# Patient Record
Sex: Female | Born: 1950 | Hispanic: No | Marital: Married | Smoking: Never smoker
Health system: Southern US, Community
[De-identification: ages and names within clinical notes are randomized; demographics above are authoritative.]

## PROBLEM LIST (undated history)

## (undated) DIAGNOSIS — Z8041 Family history of malignant neoplasm of ovary: Secondary | ICD-10-CM

## (undated) DIAGNOSIS — Z973 Presence of spectacles and contact lenses: Secondary | ICD-10-CM

## (undated) DIAGNOSIS — Z789 Other specified health status: Secondary | ICD-10-CM

## (undated) DIAGNOSIS — Z8042 Family history of malignant neoplasm of prostate: Secondary | ICD-10-CM

## (undated) DIAGNOSIS — Z8051 Family history of malignant neoplasm of kidney: Secondary | ICD-10-CM

## (undated) DIAGNOSIS — M5030 Other cervical disc degeneration, unspecified cervical region: Secondary | ICD-10-CM

## (undated) DIAGNOSIS — M8580 Other specified disorders of bone density and structure, unspecified site: Secondary | ICD-10-CM

## (undated) HISTORY — PX: TONSILLECTOMY: SUR1361

## (undated) HISTORY — DX: Family history of malignant neoplasm of kidney: Z80.51

## (undated) HISTORY — DX: Family history of malignant neoplasm of prostate: Z80.42

## (undated) HISTORY — DX: Family history of malignant neoplasm of ovary: Z80.41

---

## 1994-06-08 HISTORY — PX: ABDOMINAL HYSTERECTOMY: SHX81

## 1999-03-26 ENCOUNTER — Other Ambulatory Visit: Admission: RE | Admit: 1999-03-26 | Discharge: 1999-03-26 | Payer: Self-pay | Admitting: Gynecology

## 2000-07-28 ENCOUNTER — Other Ambulatory Visit: Admission: RE | Admit: 2000-07-28 | Discharge: 2000-07-28 | Payer: Self-pay | Admitting: Obstetrics and Gynecology

## 2001-08-29 ENCOUNTER — Other Ambulatory Visit: Admission: RE | Admit: 2001-08-29 | Discharge: 2001-08-29 | Payer: Self-pay | Admitting: Obstetrics and Gynecology

## 2002-09-13 ENCOUNTER — Other Ambulatory Visit: Admission: RE | Admit: 2002-09-13 | Discharge: 2002-09-13 | Payer: Self-pay | Admitting: Obstetrics and Gynecology

## 2005-02-12 ENCOUNTER — Other Ambulatory Visit: Admission: RE | Admit: 2005-02-12 | Discharge: 2005-02-12 | Payer: Self-pay | Admitting: Obstetrics and Gynecology

## 2006-10-22 ENCOUNTER — Ambulatory Visit: Payer: Self-pay | Admitting: Sports Medicine

## 2006-10-22 DIAGNOSIS — M549 Dorsalgia, unspecified: Secondary | ICD-10-CM | POA: Insufficient documentation

## 2006-10-22 DIAGNOSIS — M2560 Stiffness of unspecified joint, not elsewhere classified: Secondary | ICD-10-CM

## 2008-02-29 ENCOUNTER — Ambulatory Visit: Payer: Self-pay | Admitting: Hematology

## 2008-06-08 HISTORY — PX: BILATERAL SALPINGOOPHORECTOMY: SHX1223

## 2008-08-28 ENCOUNTER — Encounter (INDEPENDENT_AMBULATORY_CARE_PROVIDER_SITE_OTHER): Payer: Self-pay | Admitting: Obstetrics and Gynecology

## 2008-08-28 ENCOUNTER — Ambulatory Visit (HOSPITAL_COMMUNITY): Admission: RE | Admit: 2008-08-28 | Discharge: 2008-08-28 | Payer: Self-pay | Admitting: Obstetrics and Gynecology

## 2010-09-18 LAB — CBC
HCT: 39 % (ref 36.0–46.0)
MCHC: 33.5 g/dL (ref 30.0–36.0)
MCV: 92.6 fL (ref 78.0–100.0)
Platelets: 241 10*3/uL (ref 150–400)
RBC: 4.21 MIL/uL (ref 3.87–5.11)

## 2010-10-21 NOTE — Op Note (Signed)
NAMERAYANN, JOLLEY               ACCOUNT NO.:  [id number]   MEDICAL RECORD NO.:  [id number]          PATIENT TYPE:  AMB   LOCATION:  SDC                           FACILITY:  [facility]   PHYSICIAN:  Michelle L. Grewal, M.D.DATE OF BIRTH:  1951-06-03   DATE OF PROCEDURE:  08/28/2008  DATE OF DISCHARGE:                               OPERATIVE REPORT   PREOPERATIVE DIAGNOSES:  Family history of breast cancer and desires  prophylactic oophorectomy.   POSTOPERATIVE DIAGNOSES:  Family history of breast cancer and desires  prophylactic oophorectomy.   PROCEDURE:  Laparoscopic bilateral salpingo-oophorectomy.   SURGEON:  Michelle L. Vincente Poli, MD   ANESTHESIA:  General.   FINDINGS:  Adherent ovaries to pelvic sidewall.   SPECIMENS:  Ovaries and fallopian tubes sent to Pathology.   ESTIMATED BLOOD LOSS:  Minimal.   COMPLICATIONS:  None.   PROCEDURE IN DETAIL:  The patient was taken to the operating room.  She  was intubated without difficulty.  She was then prepped and draped in  the usual sterile fashion.  Prior to going to the operating room, the  patient had been counseled in the office about the known risk of the  procedure, which included risk of anesthesia, risk of infection, risk of  excessive bleeding, risk of injury to internal organs such as the ureter  and the bowel and the bladder.  The patient had been counseled and  accepted these known risk.  After she was intubated, she was prepped and  draped in the standard fashion, in-and-out catheter was used to empty  the bladder, and a sponge stick was placed in the vagina.  Attention was  turned to the abdomen and small infraumbilical incision was made and  carried down and the Veress needle was inserted one time and  pneumoperitoneum was performed.  The Veress needle was removed.  The 11-  mm trocar was inserted through the same incision.  The laparoscope was  introduced through the trocar sheath.  Everything and the bowel  appeared  normal.  The patient was placed in Trendelenburg position.  A second 5-  mm suprapubic trocar was inserted under direct visualization.  Exam of  the pelvis revealed the following.  The patient was status post  hysterectomy.  Her ovaries were small and appeared normal.  They were  adherent to the pelvic sidewall.  I initially identified both ureters  and they were well below the ovaries and I noticed peristalsis of both  sides.  An atraumatic grasper was used to grasp the left tube and ovary.  The EnSeal instrument was placed just beneath the ovary on the fallopian  tube across the IP ligament and it was cauterized and this was carried  through until the entire specimen was resected.  Careful attention was  used to just stay just beneath the ovary and avoid the ureter.  I noted  normal peristalsis after the area where the ovary had been where it is  kind of a raw surface, because it was adherent to the sidewall.  There  is a little bit of oozing on that side, which  was hemostatic after we  used Kleppingers to both the area.  Again, ureteral peristalsis was  noted after this was done.  We then turned our attention to the right  side where the right tube and ovary was grasped with an atraumatic  grasper and in similar fashion EnSeal instrument was used to place  across the IP ligament and then it was cauterized and until we got  around the tube and ovary.  Again normal ureteral peristalsis was noted  afterwards.  The 5-mm trocar was then switched to a 10 under direct  visualization.  The Endobag was inserted and the specimens were inserted  into the Endobag and they were removed easily without any difficulty.  Irrigation was performed.  Hemostasis was again noted.  Because of the  raw surface on the left pelvic sidewall, I did place a piece of  Interceed over there to hopefully avoid any bowel adhesion to that area.  Pneumoperitoneum was released.  The trocars were removed.  The  incisions  were closed with 0 Vicryl and then the skin was closed with 3-0 Vicryl,  and then skin was sealed with Dermabond.  All sponge, lap, and  instrument counts were correct x2.  The patient went to recovery room in  stable condition.      Michelle L. Vincente Poli, M.D.  Electronically Signed     MLG/MEDQ  D:  08/28/2008  T:  08/28/2008  Job:  [id number]

## 2012-06-10 ENCOUNTER — Other Ambulatory Visit: Payer: Self-pay | Admitting: Orthopedic Surgery

## 2012-06-13 ENCOUNTER — Encounter (HOSPITAL_BASED_OUTPATIENT_CLINIC_OR_DEPARTMENT_OTHER): Payer: Self-pay | Admitting: *Deleted

## 2012-06-13 NOTE — Progress Notes (Signed)
No htn-no resp problems

## 2012-06-14 ENCOUNTER — Encounter (HOSPITAL_BASED_OUTPATIENT_CLINIC_OR_DEPARTMENT_OTHER): Payer: Self-pay | Admitting: *Deleted

## 2012-06-14 ENCOUNTER — Ambulatory Visit (HOSPITAL_BASED_OUTPATIENT_CLINIC_OR_DEPARTMENT_OTHER)
Admission: RE | Admit: 2012-06-14 | Discharge: 2012-06-14 | Disposition: A | Discharge status: Home / Self Care | Payer: BC Managed Care – PPO | Source: Ambulatory Visit | Attending: Orthopedic Surgery | Admitting: Orthopedic Surgery

## 2012-06-14 ENCOUNTER — Encounter (HOSPITAL_BASED_OUTPATIENT_CLINIC_OR_DEPARTMENT_OTHER): Payer: Self-pay | Admitting: Anesthesiology

## 2012-06-14 ENCOUNTER — Ambulatory Visit (HOSPITAL_BASED_OUTPATIENT_CLINIC_OR_DEPARTMENT_OTHER): Payer: BC Managed Care – PPO | Admitting: Anesthesiology

## 2012-06-14 ENCOUNTER — Encounter (HOSPITAL_BASED_OUTPATIENT_CLINIC_OR_DEPARTMENT_OTHER)
Admission: RE | Disposition: A | Discharge status: Home / Self Care | Payer: Self-pay | Source: Ambulatory Visit | Attending: Orthopedic Surgery

## 2012-06-14 DIAGNOSIS — R229 Localized swelling, mass and lump, unspecified: Secondary | ICD-10-CM | POA: Insufficient documentation

## 2012-06-14 HISTORY — DX: Other specified disorders of bone density and structure, unspecified site: M85.80

## 2012-06-14 HISTORY — DX: Other cervical disc degeneration, unspecified cervical region: M50.30

## 2012-06-14 HISTORY — DX: Other specified health status: Z78.9

## 2012-06-14 HISTORY — DX: Presence of spectacles and contact lenses: Z97.3

## 2012-06-14 HISTORY — PX: MASS EXCISION: SHX2000

## 2012-06-14 LAB — POCT HEMOGLOBIN-HEMACUE: Hemoglobin: 10.6 g/dL — ABNORMAL LOW (ref 12.0–15.0)

## 2012-06-14 SURGERY — EXCISION MASS
Anesthesia: Monitor Anesthesia Care | Site: Finger | Laterality: Right | Wound class: Clean

## 2012-06-14 MED ORDER — OXYCODONE HCL 5 MG PO TABS: 5.0000 mg | ORAL_TABLET | Freq: Once | ORAL | Status: DC | PRN

## 2012-06-14 MED ORDER — LACTATED RINGERS IV SOLN
INTRAVENOUS | Status: DC
  Administered 2012-06-14 (×2): via INTRAVENOUS

## 2012-06-14 MED ORDER — FENTANYL CITRATE 0.05 MG/ML IJ SOLN
INTRAMUSCULAR | Status: DC | PRN
  Administered 2012-06-14: 50 ug via INTRAVENOUS

## 2012-06-14 MED ORDER — PROPOFOL INFUSION 10 MG/ML OPTIME
INTRAVENOUS | Status: DC | PRN
  Administered 2012-06-14: 50 ug/kg/min via INTRAVENOUS

## 2012-06-14 MED ORDER — ONDANSETRON HCL 4 MG/2ML IJ SOLN: 4.0000 mg | Freq: Four times a day (QID) | INTRAMUSCULAR | Status: DC | PRN

## 2012-06-14 MED ORDER — CEFAZOLIN SODIUM-DEXTROSE 2-3 GM-% IV SOLR: 2.0000 g | Freq: Once | INTRAVENOUS | Status: DC

## 2012-06-14 MED ORDER — OXYCODONE HCL 5 MG/5ML PO SOLN: 5.0000 mg | Freq: Once | ORAL | Status: DC | PRN

## 2012-06-14 MED ORDER — FENTANYL CITRATE 0.05 MG/ML IJ SOLN: 25.0000 ug | INTRAMUSCULAR | Status: DC | PRN

## 2012-06-14 MED ORDER — PERCOCET 5-325 MG PO TABS: ORAL_TABLET | ORAL | Status: DC

## 2012-06-14 MED ORDER — LIDOCAINE HCL (CARDIAC) 20 MG/ML IV SOLN
INTRAVENOUS | Status: DC | PRN
  Administered 2012-06-14: 30 mg via INTRAVENOUS

## 2012-06-14 MED ORDER — CHLORHEXIDINE GLUCONATE 4 % EX LIQD: 60.0000 mL | Freq: Once | CUTANEOUS | Status: DC

## 2012-06-14 MED ORDER — ONDANSETRON HCL 4 MG/2ML IJ SOLN
INTRAMUSCULAR | Status: DC | PRN
  Administered 2012-06-14: 4 mg via INTRAVENOUS

## 2012-06-14 MED ORDER — LIDOCAINE HCL (PF) 0.5 % IJ SOLN
INTRAMUSCULAR | Status: DC | PRN
  Administered 2012-06-14: 30 mL via INTRAVENOUS

## 2012-06-14 MED ORDER — BUPIVACAINE HCL (PF) 0.25 % IJ SOLN
INTRAMUSCULAR | Status: DC | PRN
  Administered 2012-06-14: 10 mL

## 2012-06-14 MED ORDER — MIDAZOLAM HCL 5 MG/5ML IJ SOLN
INTRAMUSCULAR | Status: DC | PRN
  Administered 2012-06-14: 1 mg via INTRAVENOUS

## 2012-06-14 SURGICAL SUPPLY — 48 items
BANDAGE CONFORM 2  STR LF (GAUZE/BANDAGES/DRESSINGS) ×1 IMPLANT
BANDAGE ELASTIC 3 VELCRO ST LF (GAUZE/BANDAGES/DRESSINGS) IMPLANT
BANDAGE GAUZE ELAST BULKY 4 IN (GAUZE/BANDAGES/DRESSINGS) IMPLANT
BLADE MINI RND TIP GREEN BEAV (BLADE) IMPLANT
BLADE SURG 15 STRL LF DISP TIS (BLADE) ×2 IMPLANT
BLADE SURG 15 STRL SS (BLADE) ×4
BNDG CMPR 9X4 STRL LF SNTH (GAUZE/BANDAGES/DRESSINGS)
BNDG CMPR MD 5X2 ELC HKLP STRL (GAUZE/BANDAGES/DRESSINGS)
BNDG COHESIVE 1X5 TAN STRL LF (GAUZE/BANDAGES/DRESSINGS) ×2 IMPLANT
BNDG ELASTIC 2 VLCR STRL LF (GAUZE/BANDAGES/DRESSINGS) IMPLANT
BNDG ESMARK 4X9 LF (GAUZE/BANDAGES/DRESSINGS) IMPLANT
CHLORAPREP W/TINT 26ML (MISCELLANEOUS) ×2 IMPLANT
CLOTH BEACON ORANGE TIMEOUT ST (SAFETY) ×2 IMPLANT
CORDS BIPOLAR (ELECTRODE) ×1 IMPLANT
COVER MAYO STAND STRL (DRAPES) ×2 IMPLANT
COVER TABLE BACK 60X90 (DRAPES) ×2 IMPLANT
CUFF TOURNIQUET SINGLE 18IN (TOURNIQUET CUFF) ×2 IMPLANT
DRAPE EXTREMITY T 121X128X90 (DRAPE) ×2 IMPLANT
DRAPE SURG 17X23 STRL (DRAPES) ×2 IMPLANT
GAUZE SPONGE 4X4 12PLY STRL LF (GAUZE/BANDAGES/DRESSINGS) ×1 IMPLANT
GAUZE XEROFORM 1X8 LF (GAUZE/BANDAGES/DRESSINGS) ×2 IMPLANT
GLOVE BIO SURGEON STRL SZ7.5 (GLOVE) ×3 IMPLANT
GOWN PREVENTION PLUS XLARGE (GOWN DISPOSABLE) ×1 IMPLANT
GOWN STRL REIN XL XLG (GOWN DISPOSABLE) ×3 IMPLANT
NDL FILTER BLUNT 18X1 1/2 (NEEDLE) IMPLANT
NDL HYPO 25X1 1.5 SAFETY (NEEDLE) IMPLANT
NEEDLE FILTER BLUNT 18X 1/2SAF (NEEDLE)
NEEDLE FILTER BLUNT 18X1 1/2 (NEEDLE) IMPLANT
NEEDLE HYPO 25X1 1.5 SAFETY (NEEDLE) ×2 IMPLANT
NS IRRIG 1000ML POUR BTL (IV SOLUTION) ×2 IMPLANT
PACK BASIN DAY SURGERY FS (CUSTOM PROCEDURE TRAY) ×2 IMPLANT
PAD CAST 3X4 CTTN HI CHSV (CAST SUPPLIES) IMPLANT
PADDING CAST ABS 4INX4YD NS (CAST SUPPLIES) ×1
PADDING CAST ABS COTTON 4X4 ST (CAST SUPPLIES) ×1 IMPLANT
PADDING CAST COTTON 3X4 STRL (CAST SUPPLIES)
SPONGE GAUZE 4X4 12PLY (GAUZE/BANDAGES/DRESSINGS) ×2 IMPLANT
STOCKINETTE 4X48 STRL (DRAPES) ×2 IMPLANT
STRIP CLOSURE SKIN 1/4X4 (GAUZE/BANDAGES/DRESSINGS) ×1 IMPLANT
SUT ETHILON 3 0 PS 1 (SUTURE) IMPLANT
SUT ETHILON 4 0 PS 2 18 (SUTURE) IMPLANT
SUT MON AB 5-0 PS2 18 (SUTURE) ×1 IMPLANT
SWAB COLLECTION DEVICE MRSA (MISCELLANEOUS) IMPLANT
SYR BULB 3OZ (MISCELLANEOUS) ×2 IMPLANT
SYR CONTROL 10ML LL (SYRINGE) ×1 IMPLANT
TOWEL OR 17X24 6PK STRL BLUE (TOWEL DISPOSABLE) ×3 IMPLANT
TUBE ANAEROBIC SPECIMEN COL (MISCELLANEOUS) IMPLANT
UNDERPAD 30X30 INCONTINENT (UNDERPADS AND DIAPERS) ×2 IMPLANT
WATER STERILE IRR 1000ML POUR (IV SOLUTION) ×1 IMPLANT

## 2012-06-14 NOTE — Brief Op Note (Signed)
06/14/2012  8:26 AM  PATIENT:  Crystal Anthony  62 y.o. female  PRE-OPERATIVE DIAGNOSIS:  RIGHT INDEX Finger Mass  POST-OPERATIVE DIAGNOSIS:  RIGHT INDEX Finger Mass  PROCEDURE:  Procedure(s) (LRB) with comments: EXCISION MASS (Right) - index finger  SURGEON:  Surgeon(s) and Role:    * Tami Ribas, MD - Primary  PHYSICIAN ASSISTANT:   ASSISTANTS: none   ANESTHESIA:   MAC and bier block  EBL:  Total I/O In: 1000 [I.V.:1000] Out: -   BLOOD ADMINISTERED:none  DRAINS: none   LOCAL MEDICATIONS USED:  MARCAINE     SPECIMEN:  Source of Specimen:  right index  DISPOSITION OF SPECIMEN:  PATHOLOGY  COUNTS:  YES  TOURNIQUET:   Total Tourniquet Time Documented: Forearm (Right) - 31 minutes  DICTATION: .Other Dictation: Dictation Number [id number]  PLAN OF CARE: Discharge to home after PACU  PATIENT DISPOSITION:  PACU - hemodynamically stable.

## 2012-06-14 NOTE — Anesthesia Postprocedure Evaluation (Signed)
Anesthesia Post Note  Patient: Crystal Anthony  Procedure(s) Performed: Procedure(s) (LRB): EXCISION MASS (Right)  Anesthesia type: MAC and Bier block  Patient location: PACU  Post pain: Pain level controlled and Adequate analgesia  Post assessment: Post-op Vital signs reviewed, Patient's Cardiovascular Status Stable and Respiratory Function Stable  Last Vitals:  Filed Vitals:   06/14/12 0856  BP:   Pulse: 75  Temp:   Resp: 13    Post vital signs: Reviewed and stable  Level of consciousness: awake, alert  and oriented  Complications: No apparent anesthesia complications

## 2012-06-14 NOTE — H&P (Signed)
  Crystal Anthony is an 62 y.o. female.   Chief Complaint: right index finger mass HPI: 62 yo rhd female with 2 months of mass/foreign body on the dorsum of the right index finger at PIP joint.  Rose thorn injury to area in November.  She was able to remove a portion of the thorn but has had a bothersome mass in the area since.  No fevers, chills, night sweats.  Past Medical History  Diagnosis Date  . No pertinent past medical history   . DDD (degenerative disc disease), cervical   . Osteopenia   . Wears glasses     Past Surgical History  Procedure Date  . Abdominal hysterectomy   . Tonsillectomy   . Bilateral salpingoophorectomy 2010    History reviewed. No pertinent family history. Social History:  reports that she has never smoked. She does not have any smokeless tobacco history on file. She reports that she drinks alcohol. She reports that she does not use illicit drugs.  Allergies: No Known Allergies  Medications Prior to Admission  Medication Sig Dispense Refill  . amphetamine-dextroamphetamine (ADDERALL XR) 15 MG 24 hr capsule Take 15 mg by mouth every morning.      . calcium carbonate (OS-CAL - DOSED IN MG OF ELEMENTAL CALCIUM) 1250 MG tablet Take 1 tablet by mouth daily.      . cholecalciferol (VITAMIN D) 1000 UNITS tablet Take 1,000 Units by mouth daily.      Marland Kitchen ibandronate (BONIVA) 150 MG tablet Take 150 mg by mouth every 30 (thirty) days. Take in the morning with a full glass of water, on an empty stomach, and do not take anything else by mouth or lie down for the next 30 min.        Results for orders placed during the hospital encounter of 06/14/12 (from the past 48 hour(s))  POCT HEMOGLOBIN-HEMACUE     Status: Abnormal   Collection Time   06/14/12  6:45 AM      Component Value Range Comment   Hemoglobin 10.6 (*) 12.0 - 15.0 g/dL     No results found.   A comprehensive review of systems was negative except for: Eyes: positive for contacts/glasses  Blood  pressure 143/87, pulse 76, temperature 97.7 F (36.5 C), temperature source Oral, resp. rate 18, height 5\' 7"  (1.702 m), weight 66.86 kg (147 lb 6.4 oz), SpO2 100.00%.  General appearance: alert, cooperative and appears stated age Head: Normocephalic, without obvious abnormality, atraumatic Neck: supple, symmetrical, trachea midline Resp: clear to auscultation bilaterally Cardio: regular rate and rhythm GI: non tender Extremities: light touch sensation and capillary refill intact all digits.  +epl/fpl/io.  mass on dorsum of right index finger at pip joint.  no skin changes. Pulses: 2+ and symmetric Skin: Skin color, texture, turgor normal. No rashes or lesions Neurologic: Grossly normal Incision/Wound: na  Assessment/Plan Right index finger mass vs retained foreign body.  Non operative and operative treatment options were discussed with the patient and patient wishes to proceed with operative treatment. Risks, benefits, and alternatives of surgery were discussed and the patient agrees with the plan of care.   Chenille Toor R 06/14/2012, 7:38 AM

## 2012-06-14 NOTE — Anesthesia Preprocedure Evaluation (Signed)
Anesthesia Evaluation  Patient identified by MRN, date of birth, ID band Patient awake    Reviewed: Allergy & Precautions, H&P , NPO status , Patient's Chart, lab work & pertinent test results  Airway Mallampati: II  Neck ROM: full    Dental   Pulmonary          Cardiovascular     Neuro/Psych    GI/Hepatic   Endo/Other    Renal/GU      Musculoskeletal   Abdominal   Peds  Hematology   Anesthesia Other Findings   Reproductive/Obstetrics                           Anesthesia Physical Anesthesia Plan  ASA: II  Anesthesia Plan: MAC   Post-op Pain Management:    Induction: Intravenous  Airway Management Planned: Simple Face Mask  Additional Equipment:   Intra-op Plan:   Post-operative Plan:   Informed Consent: I have reviewed the patients History and Physical, chart, labs and discussed the procedure including the risks, benefits and alternatives for the proposed anesthesia with the patient or authorized representative who has indicated his/her understanding and acceptance.     Plan Discussed with: CRNA and Surgeon  Anesthesia Plan Comments:         Anesthesia Quick Evaluation

## 2012-06-14 NOTE — Transfer of Care (Signed)
Immediate Anesthesia Transfer of Care Note  Patient: Crystal Anthony  Procedure(s) Performed: Procedure(s) (LRB) with comments: EXCISION MASS (Right) - index finger  Patient Location: PACU  Anesthesia Type:Bier block  Level of Consciousness: awake, alert , oriented and patient cooperative  Airway & Oxygen Therapy: Patient Spontanous Breathing and Patient connected to face mask oxygen  Post-op Assessment: Report given to PACU RN and Post -op Vital signs reviewed and stable  Post vital signs: Reviewed and stable  Complications: No apparent anesthesia complications

## 2012-06-14 NOTE — Anesthesia Procedure Notes (Signed)
Procedure Name: MAC Date/Time: 06/14/2012 7:50 AM Performed by: Bern Fare D Pre-anesthesia Checklist: Patient identified, Emergency Drugs available, Suction available, Patient being monitored and Timeout performed Patient Re-evaluated:Patient Re-evaluated prior to inductionOxygen Delivery Method: Simple face mask

## 2012-06-15 NOTE — Op Note (Signed)
NAMEJEROME, Anthony NO.:  [id number]  MEDICAL RECORD NO.:  LOCATION:                                 FACILITY:  PHYSICIAN:  Betha Loa, MD             DATE OF BIRTH:  DATE OF PROCEDURE: DATE OF DISCHARGE:                              OPERATIVE REPORT   PREOPERATIVE DIAGNOSIS:  Right index finger mass.  POSTOPERATIVE DIAGNOSIS:  Right index finger mass.  PROCEDURE:  Excision of mass, right index finger.  SURGEON:  Betha Loa, MD  ASSISTANT:  None.  ANESTHESIA:  Bier block with sedation.  IV FLUIDS:  Per anesthesia flow sheet.  ESTIMATED BLOOD LOSS:  Minimal.  COMPLICATIONS:  None.  SPECIMENS:  Right index finger mass to Pathology.  TOURNIQUET TIME:  Thirty-one minutes.  DISPOSITION:  Stable to PACU.  INDICATIONS:  [name] is a 62 year old female who states that 2 months ago, she poked her right index finger with a rose thorn.  She was able to remove most of the thorn, but has had a mass since.  She thinks there may be some foreign body remaining.  It is bothersome to her.  She wished to have it removed.  Risks, benefits, and alternatives of surgery were discussed including the risk of blood loss, infection, damage to nerves, vessels, tendons, ligaments, bone; failure of surgery; need for additional surgery, complications with wound healing, continued pain, retained foreign body, and recurrence of mass.  She voiced understanding of these risks and elected to proceed.  OPERATIVE COURSE:  After being identified preoperatively by myself, the patient and I agreed upon procedure and site procedure.  Surgical site was marked.  Risks, benefits, and alternatives of surgery were reviewed and she wished to proceed.  Surgical consent had been signed.  She was given 2 g of IV Ancef as preoperative antibiotic prophylaxis.  She was transferred to the operating room, placed the operating room table in supine position with the right upper extremity on  arm board.  Bier block anesthesia was induced by the anesthesiologist.  Right upper extremity was prepped and draped in normal sterile orthopedic fashion.  A surgical pause was performed between surgeons, anesthesia, operating staff, and all were in agreement as to the patient, procedure, site procedure.  The Bier block was augmented with a digital block performed with 10 mL of 0.25% plain Marcaine.  A curvilinear incision was made over the dorsum of the PIP joint of the index finger.  It was carried into subcutaneous tissues by spreading technique.  The mass was easily identified.  It was released of all soft tissue attachments.  It was not strongly adherent. It was removed in its entirety.  It was peeled off from the dorsum of the extensor tendon.  The extensor tendon was intact.  The mass was sent to Pathology for examination.  The wound was copiously irrigated with sterile saline.  Bipolar electrocautery was used to obtain hemostasis. The wound was then closed with 5-0 Monocryl in a running subcuticular fashion.  This was augmented with Steri-Strips.  The wound was dressed with sterile Xeroform, 4x4s, and wrapped  with a Kling and Coban dressing lightly.  Tourniquet was deflated at 31 minutes.  Fingertips were pink with brisk capillary refill after deflation of tourniquet.  Operative drapes were broken down.  The patient was awoken from anesthesia safely. She was transferred back to stretcher and taken to PACU in stable condition.  I will see her back in the office in 1 week for postoperative followup.  I will give her Percocet 5/325 one to two p.o. q.6 h. p.r.n. pain, dispensed #20.     Betha Loa, MD     KK/MEDQ  D:  06/14/2012  T:  06/14/2012  Job:  [id number]

## 2012-06-16 ENCOUNTER — Encounter (HOSPITAL_BASED_OUTPATIENT_CLINIC_OR_DEPARTMENT_OTHER): Payer: Self-pay | Admitting: Orthopedic Surgery

## 2012-07-11 ENCOUNTER — Other Ambulatory Visit: Payer: Self-pay | Admitting: Obstetrics and Gynecology

## 2013-10-17 ENCOUNTER — Other Ambulatory Visit: Payer: Self-pay | Admitting: Obstetrics and Gynecology

## 2014-05-21 ENCOUNTER — Other Ambulatory Visit: Payer: Self-pay | Admitting: Dermatology

## 2014-12-24 ENCOUNTER — Encounter: Payer: Self-pay | Admitting: Genetic Counselor

## 2014-12-31 ENCOUNTER — Other Ambulatory Visit: Payer: Self-pay | Admitting: Obstetrics and Gynecology

## 2015-01-01 LAB — CYTOLOGY - PAP

## 2015-01-22 ENCOUNTER — Encounter: Payer: Self-pay | Admitting: Genetic Counselor

## 2015-01-22 DIAGNOSIS — Z1379 Encounter for other screening for genetic and chromosomal anomalies: Secondary | ICD-10-CM | POA: Insufficient documentation

## 2015-01-23 ENCOUNTER — Ambulatory Visit (HOSPITAL_BASED_OUTPATIENT_CLINIC_OR_DEPARTMENT_OTHER): Payer: BLUE CROSS/BLUE SHIELD | Admitting: Genetic Counselor

## 2015-01-23 ENCOUNTER — Encounter: Payer: Self-pay | Admitting: Genetic Counselor

## 2015-01-23 ENCOUNTER — Other Ambulatory Visit: Payer: Self-pay

## 2015-01-23 DIAGNOSIS — Z8051 Family history of malignant neoplasm of kidney: Secondary | ICD-10-CM | POA: Diagnosis not present

## 2015-01-23 DIAGNOSIS — Z8042 Family history of malignant neoplasm of prostate: Secondary | ICD-10-CM

## 2015-01-23 DIAGNOSIS — Z315 Encounter for genetic counseling: Secondary | ICD-10-CM

## 2015-01-23 DIAGNOSIS — Z8041 Family history of malignant neoplasm of ovary: Secondary | ICD-10-CM | POA: Insufficient documentation

## 2015-01-23 DIAGNOSIS — Z1379 Encounter for other screening for genetic and chromosomal anomalies: Secondary | ICD-10-CM

## 2015-01-23 NOTE — Progress Notes (Signed)
REFERRING PROVIDER: Dian Queen, MD [address] [place], [place] [address]  PRIMARY PROVIDER:  Cyril Mourning, MD  PRIMARY REASON FOR VISIT:  1. Family history of ovarian cancer   2. Family history of prostate cancer   3. Family history of kidney cancer   4. Genetic testing      HISTORY OF PRESENT ILLNESS:   Crystal Anthony, a 64 y.o. female, was seen for a [facility] cancer genetics consultation at the request of Dr. Helane Rima due to a family history of ovarian, kidney and prostate cancer.  Crystal Anthony presents to clinic today to discuss the possibility of a hereditary predisposition to cancer, genetic testing, and to further clarify her future cancer risks, as well as potential cancer risks for family members. Crystal Anthony is a 64 y.o. female with no personal history of cancer.  She had genetic testing in 2009 and was found to be negative for sequencing mutations in BRCA1, BRCA2, MLH1, MSH2 and MSH6.  CANCER HISTORY:   No history exists.     HORMONAL RISK FACTORS:  Menarche was at age 37-16.  First live birth at age 31.  OCP use for approximately 1 years.  Ovaries intact: no.  Hysterectomy: yes.  Menopausal status: postmenopausal.  HRT use: 7 years. Colonoscopy: no; not examined. Mammogram within the last year: yes. Number of breast biopsies: 0. Up to date with pelvic exams:  yes. Any excessive radiation exposure in the past:  no  Past Medical History  Diagnosis Date  . No pertinent past medical history   . DDD (degenerative disc disease), cervical   . Osteopenia   . Wears glasses   . Family history of ovarian cancer   . Family history of prostate cancer   . Family history of kidney cancer     Past Surgical History  Procedure Laterality Date  . Abdominal hysterectomy    . Tonsillectomy    . Bilateral salpingoophorectomy  2010  . Mass excision  06/14/2012    Procedure: EXCISION MASS;  Surgeon: Tennis Must, MD;  Location: [facility];   Service: Orthopedics;  Laterality: Right;  index finger    Social History   Social History  . Marital Status: Married    Spouse Name: N/A  . Number of Children: 2  . Years of Education: N/A   Social History Main Topics  . Smoking status: Never Smoker   . Smokeless tobacco: None  . Alcohol Use: Yes     Comment: rare  . Drug Use: No  . Sexual Activity: Not Asked   Other Topics Concern  . None   Social History Narrative     FAMILY HISTORY:  We obtained a detailed, 4-generation family history.  Significant diagnoses are listed below: Family History  Problem Relation Age of Onset  . Ovarian cancer Mother 53  . Kidney cancer Father 12  . Bone cancer Father 31    mets?  . Kidney cancer Maternal Uncle     dx in his 102s  . Colon polyps Paternal Aunt   . Heart disease Paternal Aunt   . Prostate cancer Paternal Uncle   . Heart disease Maternal Grandmother   . Heart disease Paternal Grandmother   . Heart disease Paternal Grandfather   . Prostate cancer Maternal Uncle     dx in his 48s  . Prostate cancer Maternal Uncle     dx in his 69s  . Cancer Paternal Uncle     NOS  The patient has two children and one brother who are cancer free.  Her mother was diagnosed with ovarian cancer at age 31.  She had 10 brothers and sisters.  Two brothers had prostate cancer and one brother with kidney cancer.  Her father was diagnosed with kidney cancer in his 70s and died of bone cancer.  He had 11 brothers and sisters.  One brother had prostate cancer and another brother had an unknown form of cancer.  There is no other reported family history of cancer.  Patient's maternal ancestors are of [place]/English and [place] American descent, and paternal ancestors are of [place], [place] and African American descent. There is no reported Ashkenazi Jewish ancestry. There is known consanguinity - her parents are 2nd cousins.  GENETIC COUNSELING ASSESSMENT: Crystal Anthony is a 64 y.o. female with a  family history of ovarian, kidney and prostate cancer which somewhat suggestive of a hereditary cancer syndrome and predisposition to cancer. We, therefore, discussed and recommended the following at today's visit.   DISCUSSION: We discussed the updates of BRCA and Lynch syndrome testing.  We reviewed the characteristics, features and inheritance patterns of hereditary cancer syndromes. We also discussed genetic testing, including the appropriate family members to test, the process of testing, insurance coverage and turn-around-time for results. We discussed the implications of a negative, positive and/or variant of uncertain significant result. We recommended Ms. Klauer pursue genetic testing for the OvaNext gene panel. The OvaNext gene panel offered by [facility] and includes sequencing and rearrangement analysis for the following 24 genes: ATM, BARD1, BRCA1, BRCA2, BRIP1, CDH1, CHEK2, EPCAM, MLH1, MRE11A, MSH2, MSH6, MUTYH, NBN, NF1, PALB2, PMS2, PTEN, RAD50, RAD51C, RAD51D, SMARCA4, STK11, and TP53.   Based on Ms. Brazie's family history of cancer, she meets medical criteria for genetic testing. Despite that she meets criteria, she may still have an out of pocket cost. We discussed that if her out of pocket cost for testing is over $100, the laboratory will call and confirm whether she wants to proceed with testing.  If the out of pocket cost of testing is less than $100 she will be billed by the genetic testing laboratory.   PLAN: After considering the risks, benefits, and limitations, Ms. Hartwig  provided informed consent to pursue genetic testing and the blood sample was sent to [facility] for analysis of the OvaNExt panel test. Results should be available within approximately 2-3 weeks' time, at which point they will be disclosed by telephone to Ms. Schaffer, as will any additional recommendations warranted by these results. Ms. Irigoyen will receive a summary of her genetic counseling visit  and a copy of her results once available. This information will also be available in Epic. We encouraged Ms. Jozwiak to remain in contact with cancer genetics annually so that we can continuously update the family history and inform her of any changes in cancer genetics and testing that may be of benefit for her family. Ms. Quesnel questions were answered to her satisfaction today. Our contact information was provided should additional questions or concerns arise.  Lastly, we encouraged Ms. Prigmore to remain in contact with cancer genetics annually so that we can continuously update the family history and inform her of any changes in cancer genetics and testing that may be of benefit for this family.   Ms.  Mikolajczak questions were answered to her satisfaction today. Our contact information was provided should additional questions or concerns arise. Thank you for the referral and allowing [place] to  share in the care of your patient.   Karen P. Florene Glen, [place], [facility] Certified Genetic Counselor [email or web address] phone: [phone]  The patient was seen for a total of 60 minutes in face-to-face genetic counseling.  This patient was discussed with Drs. Magrinat, Lindi Adie and/or Burr Medico who agrees with the above.    _______________________________________________________________________ For Office Staff:  Number of people involved in session: 1 Was an Intern/ student involved with case: no

## 2015-02-07 ENCOUNTER — Encounter (HOSPITAL_COMMUNITY): Payer: Self-pay

## 2015-02-15 ENCOUNTER — Ambulatory Visit: Payer: Self-pay | Admitting: Genetic Counselor

## 2015-02-15 ENCOUNTER — Telehealth: Payer: Self-pay | Admitting: Genetic Counselor

## 2015-02-15 DIAGNOSIS — Z8051 Family history of malignant neoplasm of kidney: Secondary | ICD-10-CM

## 2015-02-15 DIAGNOSIS — Z8041 Family history of malignant neoplasm of ovary: Secondary | ICD-10-CM

## 2015-02-15 DIAGNOSIS — Z8042 Family history of malignant neoplasm of prostate: Secondary | ICD-10-CM

## 2015-02-15 DIAGNOSIS — Z1379 Encounter for other screening for genetic and chromosomal anomalies: Secondary | ICD-10-CM

## 2015-02-15 NOTE — Telephone Encounter (Signed)
Revealed negative genetic testing on the OvaNext panel testing.  Discussed BARD1 VUS and that it is not a clinically actionable finding.  Patient understands and states that she is not overly concerned.

## 2015-02-15 NOTE — Progress Notes (Signed)
HPI: Crystal Anthony was previously seen in the [facility] due to a family history of ovarian and prostate cancer and concerns regarding a hereditary predisposition to cancer. Please refer to our prior cancer genetics clinic note for more information regarding Crystal Anthony's medical, social and family histories, and our assessment and recommendations, at the time. Crystal Anthony recent genetic test results were disclosed to her, as were recommendations warranted by these results. These results and recommendations are discussed in more detail below.  FAMILY HISTORY:  We obtained a detailed, 4-generation family history.  Significant diagnoses are listed below: Family History  Problem Relation Age of Onset  . Ovarian cancer Mother 74  . Kidney cancer Father 26  . Bone cancer Father 57    mets?  . Kidney cancer Maternal Uncle     dx in his 59s  . Colon polyps Paternal Aunt   . Heart disease Paternal Aunt   . Prostate cancer Paternal Uncle   . Heart disease Maternal Grandmother   . Heart disease Paternal Grandmother   . Heart disease Paternal Grandfather   . Prostate cancer Maternal Uncle     dx in his 3s  . Prostate cancer Maternal Uncle     dx in his 2s  . Cancer Paternal Uncle     NOS    The patient has two children and one brother who are cancer free. Her mother was diagnosed with ovarian cancer at age 24. She had 10 brothers and sisters. Two brothers had prostate cancer and one brother with kidney cancer. Her father was diagnosed with kidney cancer in his 71s and died of bone cancer. He had 11 brothers and sisters. One brother had prostate cancer and another brother had an unknown form of cancer. There is no other reported family history of cancer. Patient's maternal ancestors are of [place]/English and [place] American descent, and paternal ancestors are of [place], [place] and African American descent. There is no reported Ashkenazi Jewish ancestry. There is  known consanguinity - her parents are 2nd cousins.  GENETIC TEST RESULTS: At the time of Crystal Anthony's visit, we recommended she pursue genetic testing of the OvaNext gene panel. The OvaNext gene panel offered by [facility] and includes sequencing and rearrangement analysis for the following 24 genes: ATM, BARD1, BRCA1, BRCA2, BRIP1, CDH1, CHEK2, EPCAM, MLH1, MRE11A, MSH2, MSH6, MUTYH, NBN, NF1, PALB2, PMS2, PTEN, RAD50, RAD51C, RAD51D, SMARCA4, STK11, and TP53.  The report date is February 13, 2015.  Genetic testing was normal, and did not reveal a deleterious mutation in these genes. The test report has been scanned into EPIC and is located under the Molecular Pathology section of the Results Review tab. The patient will receive a copy of the results via a secure email.  We discussed with Crystal Anthony that since the current genetic testing is not perfect, it is possible there may be a gene mutation in one of these genes that current testing cannot detect, but that chance is small. We also discussed, that it is possible that another gene that has not yet been discovered, or that we have not yet tested, is responsible for the cancer diagnoses in the family, and it is, therefore, important to remain in touch with cancer genetics in the future so that we can continue to offer Crystal Anthony the most up to date genetic testing.   Genetic testing did detect a Variant of Unknown Significance in the BARD1 gene called [id number]. This is a variant in the  coding exon 5 region. At this time, it is unknown if this variant is associated with increased cancer risk or if this is a normal finding, but most variants such as this get reclassified to being inconsequential. This is not a clinically actionable result.  It should not be used to make medical management decisions. With time, we suspect the lab will determine the significance of this variant, if any. If we do learn more about it, we will try to contact Crystal Anthony to  discuss it further. However, it is important to stay in touch with [place] periodically and keep the address and phone number up to date.   CANCER SCREENING RECOMMENDATIONS: This normal result is reassuring and indicates that Crystal Anthony does not likely have an increased risk of cancer due to a mutation in one of these genes.  We, therefore, recommended  Crystal Anthony continue to follow the cancer screening guidelines provided by her primary healthcare providers.   RECOMMENDATIONS FOR FAMILY MEMBERS: Women in this family might be at some increased risk of developing cancer, over the general population risk, simply due to the family history of cancer. We recommended women in this family have a yearly mammogram beginning at age 37, or 62 years younger than the earliest onset of cancer, an an annual clinical breast exam, and perform monthly breast self-exams. Women in this family should also have a gynecological exam as recommended by their primary provider. All family members should have a colonoscopy by age 72.  FOLLOW-UP: Lastly, we discussed with Crystal Anthony that cancer genetics is a rapidly advancing field and it is possible that new genetic tests will be appropriate for her and/or her family members in the future. We encouraged her to remain in contact with cancer genetics on an annual basis so we can update her personal and family histories and let her know of advances in cancer genetics that may benefit this family.   Our contact number was provided. Crystal Anthony questions were answered to her satisfaction, and she knows she is welcome to call us at anytime with additional questions or concerns.   Roma Kayser, MS, [facility] Certified Genetic Counselor [email or web address]

## 2015-03-13 ENCOUNTER — Encounter (HOSPITAL_COMMUNITY): Payer: Self-pay

## 2015-07-19 ENCOUNTER — Ambulatory Visit
Admission: RE | Admit: 2015-07-19 | Discharge: 2015-07-19 | Disposition: A | Discharge status: Home / Self Care | Payer: BLUE CROSS/BLUE SHIELD | Source: Ambulatory Visit | Attending: Obstetrics and Gynecology | Admitting: Obstetrics and Gynecology

## 2015-07-19 ENCOUNTER — Other Ambulatory Visit: Payer: Self-pay | Admitting: Obstetrics and Gynecology

## 2015-07-19 DIAGNOSIS — R058 Other specified cough: Secondary | ICD-10-CM

## 2015-07-19 DIAGNOSIS — R05 Cough: Secondary | ICD-10-CM

## 2015-09-03 ENCOUNTER — Encounter: Payer: Self-pay | Admitting: Pulmonary Disease

## 2015-09-03 ENCOUNTER — Ambulatory Visit (INDEPENDENT_AMBULATORY_CARE_PROVIDER_SITE_OTHER): Payer: BLUE CROSS/BLUE SHIELD | Admitting: Pulmonary Disease

## 2015-09-03 ENCOUNTER — Encounter (INDEPENDENT_AMBULATORY_CARE_PROVIDER_SITE_OTHER): Payer: Self-pay

## 2015-09-03 VITALS — BP 128/82 | HR 79 | Ht 67.75 in | Wt 153.6 lb

## 2015-09-03 DIAGNOSIS — R0600 Dyspnea, unspecified: Secondary | ICD-10-CM | POA: Diagnosis not present

## 2015-09-03 NOTE — Patient Instructions (Signed)
We will give a sample of Spiriva inhaler. You will be scheduled for lung function tests.  Return to clinic in 4 weeks.

## 2015-09-03 NOTE — Progress Notes (Signed)
Subjective:    Patient ID: Crystal Anthony, female    DOB: 11-Jun-1950, 65 y.o.   MRN: [id number]  HPI  Consult for evaluation of dyspnea, wheezing, abnormal chest x-ray.  Crystal Anthony is a 65 year old with no significant past medical history. She complains of cough, wheezing since December. She had an episode of confirmed flu in January and was treated with Tamiflu. She's been noticing an increased dyspnea on exertion for the past few months. The cough is nonproductive in nature, no fevers, chills.  She had a chest x-ray done at primary care office which showed hyperinflation consistent with COPD. She's never been on inhalers in the past. She does not have any allergy symptoms, diagnosis of asthma.  DATA: CXR 07/19/15 Mild hyperinflation consistent with reactive airway disease or COPD. No acute cardiopulmonary abnormality. Image reviewed  Social History: Occasional smoker, no alcohol, drug use.  Family History: Father-bone cancer, kidney cancer. Mother-ovarian cancer  Past Medical History  Diagnosis Date  . No pertinent past medical history   . DDD (degenerative disc disease), cervical   . Osteopenia   . Wears glasses   . Family history of ovarian cancer   . Family history of prostate cancer   . Family history of kidney cancer     Current outpatient prescriptions:  .  amphetamine-dextroamphetamine (ADDERALL) 10 MG tablet, Take 10 mg by mouth daily., Disp: , Rfl: 0 .  calcium carbonate (OS-CAL - DOSED IN MG OF ELEMENTAL CALCIUM) 1250 MG tablet, Take 1 tablet by mouth daily., Disp: , Rfl:  .  celecoxib (CELEBREX) 200 MG capsule, Take 200 mg by mouth daily as needed., Disp: , Rfl: 2 .  cholecalciferol (VITAMIN D) 1000 UNITS tablet, Take 1,000 Units by mouth daily., Disp: , Rfl:  .  Glucosamine-Chondroit-Vit C-Mn (GLUCOSAMINE 1500 COMPLEX) CAPS, Take 1 capsule by mouth daily., Disp: , Rfl:  .  ibandronate (BONIVA) 150 MG tablet, Take 150 mg by mouth every 30 (thirty) days. Take in  the morning with a full glass of water, on an empty stomach, and do not take anything else by mouth or lie down for the next 30 min., Disp: , Rfl:  .  MINIVELLE 0.075 MG/24HR, Apply 1 patch twice weekly, Disp: , Rfl: 6 .  Multiple Vitamin (MULTIVITAMIN) tablet, Take 1 tablet by mouth daily., Disp: , Rfl:  .  OVER THE COUNTER MEDICATION, Tumeric 200mg  once daily, Servitol once daily, Focus Factor once daily, Disp: , Rfl:  .  vitamin B-12 (CYANOCOBALAMIN) 1000 MCG tablet, Take 1,000 mcg by mouth daily., Disp: , Rfl:    Review of Systems Nonproductive cough, wheezing, dyspnea on exertion. No fevers, chills, hemoptysis. No chest pain, palpitations. No nausea, vomiting, diarrhea, constipation. All other review of systems are negative    Objective:   Physical Exam Blood pressure 128/82, pulse 79, height 5' 7.75" (1.721 m), weight 153 lb 9.6 oz (69.673 kg), SpO2 96 %. Gen: No apparent distress Neuro: No gross focal deficits. Neck: No JVD, lymphadenopathy, thyromegaly. RS: Clear, No wheeze or crackles CVS: S1-S2 heard, no murmurs rubs gallops. Abdomen: Soft, positive bowel sounds. Extremities: No edema. Assessment & Plan:  Evaluation for cough, dyspnea, wheezing.  She needs an evaluation for COPD, reactive airway disease. She's had a minimal smoking exposure but her chest x-ray is consistent with hyperinflation. I'll give her a sample of Spiriva to try and schedule her for pulmonary function tests.  Plan: - PFTs - Trial Spiriva.  Marshell Garfinkel MD [facility]  Pager [phone] If no answer or after 3pm call: [phone] 09/03/2015, 2:57 PM

## 2015-10-08 ENCOUNTER — Other Ambulatory Visit: Payer: BLUE CROSS/BLUE SHIELD

## 2015-10-08 ENCOUNTER — Encounter: Payer: Self-pay | Admitting: Pulmonary Disease

## 2015-10-08 ENCOUNTER — Ambulatory Visit (HOSPITAL_COMMUNITY)
Admission: RE | Admit: 2015-10-08 | Discharge: 2015-10-08 | Disposition: A | Discharge status: Home / Self Care | Payer: BLUE CROSS/BLUE SHIELD | Source: Ambulatory Visit | Attending: Pulmonary Disease | Admitting: Pulmonary Disease

## 2015-10-08 ENCOUNTER — Ambulatory Visit (INDEPENDENT_AMBULATORY_CARE_PROVIDER_SITE_OTHER): Payer: BLUE CROSS/BLUE SHIELD | Admitting: Pulmonary Disease

## 2015-10-08 VITALS — BP 134/84 | HR 76 | Ht 67.0 in | Wt 150.0 lb

## 2015-10-08 DIAGNOSIS — R0600 Dyspnea, unspecified: Secondary | ICD-10-CM | POA: Diagnosis not present

## 2015-10-08 LAB — PULMONARY FUNCTION TEST
DL/VA % pred: 74 %
DL/VA: 3.86 ml/min/mmHg/L
DLCO UNC % PRED: 65 %
DLCO UNC: 18.68 ml/min/mmHg
FEF 25-75 PRE: 1.82 L/s
FEF 25-75 Post: 1.73 L/sec
FEF2575-%Change-Post: -4 %
FEF2575-%PRED-PRE: 78 %
FEF2575-%Pred-Post: 74 %
FEV1-%Change-Post: -12 %
FEV1-%PRED-POST: 69 %
FEV1-%Pred-Pre: 79 %
FEV1-Post: 1.9 L
FEV1-Pre: 2.16 L
FEV1FVC-%Change-Post: -13 %
FEV1FVC-%Pred-Pre: 99 %
FEV6-%CHANGE-POST: 1 %
FEV6-%PRED-PRE: 82 %
FEV6-%Pred-Post: 84 %
FEV6-POST: 2.87 L
FEV6-Pre: 2.83 L
FEV6FVC-%Change-Post: 0 %
FEV6FVC-%PRED-POST: 104 %
FEV6FVC-%Pred-Pre: 104 %
FVC-%Change-Post: 1 %
FVC-%Pred-Post: 81 %
FVC-%Pred-Pre: 79 %
FVC-PRE: 2.83 L
FVC-Post: 2.87 L
POST FEV6/FVC RATIO: 100 %
PRE FEV1/FVC RATIO: 77 %
Post FEV1/FVC ratio: 66 %
Pre FEV6/FVC Ratio: 100 %
RV % pred: 236 %
RV: 5.28 L
TLC % PRED: 148 %
TLC: 8.16 L

## 2015-10-08 MED ORDER — ALBUTEROL SULFATE (2.5 MG/3ML) 0.083% IN NEBU
2.5000 mg | INHALATION_SOLUTION | Freq: Once | RESPIRATORY_TRACT | Status: AC
  Administered 2015-10-08: 2.5 mg via RESPIRATORY_TRACT

## 2015-10-08 NOTE — Progress Notes (Signed)
Subjective:    Patient ID: Crystal Anthony, female    DOB: 29-Dec-1950, 65 y.o.   MRN: [id number]  HPI  Follow up for dyspnea, wheezing, abnormal chest x-ray.  Crystal Anthony is a 65 year old with no significant past medical history. She complains of cough, wheezing since December. She had an episode of confirmed flu in January and was treated with Tamiflu. She's been noticing an increased dyspnea on exertion for the past few months. The cough is nonproductive in nature, no fevers, chills.  She had a chest x-ray done at primary care office which showed hyperinflation consistent with COPD. She's never been on inhalers in the past. She was given a sample of Spiriva at the last visit. She took this only for 2 days and then forgot to continue it. She says most of her symptoms have improved however she persists with dyspnea on exertion. She does not have any allergy symptoms, diagnosis of asthma.  DATA: CXR 07/19/15 Mild hyperinflation consistent with reactive airway disease or COPD. No acute cardiopulmonary abnormality. Image reviewed  PFTs 10/08/15 FVC 2.83 (79%) FEV1 2.16 (79%) F/F 77 TLC 148% RV/TLC 157% DLCO 65%. Minimal obstructive disease, hyperinflation.   Social History: Occasional smoker, no alcohol, drug use.  Family History: Father-bone cancer, kidney cancer. Mother-ovarian cancer  Past Medical History  Diagnosis Date  . No pertinent past medical history   . DDD (degenerative disc disease), cervical   . Osteopenia   . Wears glasses   . Family history of ovarian cancer   . Family history of prostate cancer   . Family history of kidney cancer     Current outpatient prescriptions:  .  amphetamine-dextroamphetamine (ADDERALL) 10 MG tablet, Take 10 mg by mouth daily., Disp: , Rfl: 0 .  calcium carbonate (OS-CAL - DOSED IN MG OF ELEMENTAL CALCIUM) 1250 MG tablet, Take 1 tablet by mouth daily., Disp: , Rfl:  .  celecoxib (CELEBREX) 200 MG capsule, Take 200 mg by mouth daily  as needed., Disp: , Rfl: 2 .  cholecalciferol (VITAMIN D) 1000 UNITS tablet, Take 1,000 Units by mouth daily., Disp: , Rfl:  .  Glucosamine-Chondroit-Vit C-Mn (GLUCOSAMINE 1500 COMPLEX) CAPS, Take 1 capsule by mouth daily., Disp: , Rfl:  .  ibandronate (BONIVA) 150 MG tablet, Take 150 mg by mouth every 30 (thirty) days. Take in the morning with a full glass of water, on an empty stomach, and do not take anything else by mouth or lie down for the next 30 min., Disp: , Rfl:  .  MINIVELLE 0.075 MG/24HR, Apply 1 patch twice weekly, Disp: , Rfl: 6 .  Multiple Vitamin (MULTIVITAMIN) tablet, Take 1 tablet by mouth daily., Disp: , Rfl:  .  OVER THE COUNTER MEDICATION, Tumeric 200mg  once daily, Servitol once daily, Focus Factor once daily, Disp: , Rfl:  .  vitamin B-12 (CYANOCOBALAMIN) 1000 MCG tablet, Take 1,000 mcg by mouth daily., Disp: , Rfl:   Review of Systems No cough, wheezing. Positive dyspnea on exertion. No fevers, chills, hemoptysis. No chest pain, palpitations. No nausea, vomiting, diarrhea, constipation. All other review of systems are negative    Objective:   Physical Exam Blood pressure 134/84, pulse 76, height 5\' 7"  (1.702 m), weight 150 lb (68.04 kg), SpO2 99 %. Gen: No apparent distress Neuro: No gross focal deficits. Neck: No JVD, lymphadenopathy, thyromegaly. RS: Clear, No wheeze or crackles CVS: S1-S2 heard, no murmurs rubs gallops. Abdomen: Soft, positive bowel sounds. Extremities: No edema. Assessment & Plan:  Evaluation for DOE.  PFTs and chest x-ray were reviewed with her. She has evidence of hyperinflation on both the chest x-ray and PFTs, minimal obstructive disease. This may be from smoking even though she is only occasional smoker. There is no family history of emphysema. However I will evaluate this further by an alpha-1 antitrypsin level, phenotype. I'll give her another sample of Spiriva today. She'll call us if this helps with her symptoms to get a regular  prescription.  Plan: - Alpha 1 AT level, phenotype - Trial Spiriva.  Marshell Garfinkel MD [facility] Pager [phone] If no answer or after 3pm call: [phone] 10/08/2015, 2:44 PM

## 2015-10-08 NOTE — Patient Instructions (Signed)
We will send you down for blood work to look for for alpha1 antitrypsin deficiency. We will give another sample of Spiriva to try out.  Return to clinic in 3 months.

## 2015-10-10 NOTE — Progress Notes (Signed)
Quick Note:  Bronch scheduled for next Friday Oct 18, 2015 at [place] spoke with patient, she is aware of the procedure date. ______

## 2015-10-15 ENCOUNTER — Telehealth: Payer: Self-pay | Admitting: Pulmonary Disease

## 2015-10-15 DIAGNOSIS — R0600 Dyspnea, unspecified: Secondary | ICD-10-CM

## 2015-10-15 NOTE — Telephone Encounter (Addendum)
LMOM TCB x1 to inform pt her bronchoscopy has been rescheduled to 5.19.17 @ 9am at [facility] (this was rescheduled from 5.12.17 since she will be traveling/flying the day after on 5.13.17).  Per PM, she will need to come for labs next week prior to procedure for CBCD and PT/INR.

## 2015-10-18 ENCOUNTER — Ambulatory Visit (HOSPITAL_COMMUNITY)
Admission: RE | Admit: 2015-10-18 | Payer: BLUE CROSS/BLUE SHIELD | Source: Ambulatory Visit | Admitting: Pulmonary Disease

## 2015-10-18 ENCOUNTER — Encounter (HOSPITAL_COMMUNITY): Payer: BLUE CROSS/BLUE SHIELD

## 2015-10-18 ENCOUNTER — Encounter (HOSPITAL_COMMUNITY): Admission: RE | Payer: Self-pay | Source: Ambulatory Visit

## 2015-10-18 LAB — ALPHA-1 ANTITRYPSIN PHENOTYPE: A1 ANTITRYPSIN: 130 mg/dL (ref 83–199)

## 2015-10-18 SURGERY — BRONCHOSCOPY, WITH FLUOROSCOPY
Anesthesia: Moderate Sedation | Laterality: Bilateral

## 2015-10-18 NOTE — Telephone Encounter (Signed)
Pt returned call.  Discussed bronch date, time and location and the need for labs prior to procedure.  Pt did become hesitant during the conversation stating that this is becoming 'more involved' than she originally anticipated and will not be back in [place] for the labs until the day before the procedure on 5.18.17.  Pt is now asking to revisit the bronch and why it is necessary.  She is aware she likely will not receive a call until next week and is okay with this.  Dr Vaughan Browner please advise, thank you.

## 2015-10-25 ENCOUNTER — Inpatient Hospital Stay (HOSPITAL_COMMUNITY): Admission: RE | Admit: 2015-10-25 | Payer: BLUE CROSS/BLUE SHIELD | Source: Ambulatory Visit

## 2015-10-25 ENCOUNTER — Encounter (HOSPITAL_COMMUNITY): Admission: RE | Payer: Self-pay | Source: Ambulatory Visit

## 2015-10-25 ENCOUNTER — Ambulatory Visit (HOSPITAL_COMMUNITY)
Admission: RE | Admit: 2015-10-25 | Payer: BLUE CROSS/BLUE SHIELD | Source: Ambulatory Visit | Admitting: Pulmonary Disease

## 2015-10-25 SURGERY — BRONCHOSCOPY, WITH FLUOROSCOPY
Anesthesia: Moderate Sedation | Laterality: Bilateral

## 2015-11-01 NOTE — Telephone Encounter (Signed)
I spoke with the patient and discussed the recommendations for bronch. She is hesitant because of the co pay involved. She will get on medicare next year and wants to wait until then. I discussed the risk of waiting or repeating the PFTs to see if the findings of variable intrathoracic obstruction goes away vs getting a CT scan.  We decided to go for the CT scan now. Please order CT without contrast.

## 2015-11-01 NOTE — Telephone Encounter (Signed)
CT has been placed per PM.

## 2015-11-08 ENCOUNTER — Ambulatory Visit (INDEPENDENT_AMBULATORY_CARE_PROVIDER_SITE_OTHER)
Admission: RE | Admit: 2015-11-08 | Discharge: 2015-11-08 | Disposition: A | Discharge status: Home / Self Care | Payer: BLUE CROSS/BLUE SHIELD | Source: Ambulatory Visit | Attending: Pulmonary Disease | Admitting: Pulmonary Disease

## 2015-11-08 DIAGNOSIS — R0600 Dyspnea, unspecified: Secondary | ICD-10-CM

## 2015-11-11 ENCOUNTER — Telehealth: Payer: Self-pay | Admitting: Pulmonary Disease

## 2015-11-11 NOTE — Telephone Encounter (Signed)
Spoke with pt. She needs samples of Spiriva Respimat. Samples have been left at the front desk. Nothing further was needed.

## 2015-11-11 NOTE — Telephone Encounter (Signed)
The inhaler is spiriva respimat pt didn't know name of med when she first called, so she called back.Hillery Hunter

## 2015-11-14 ENCOUNTER — Encounter: Payer: Self-pay | Admitting: Pulmonary Disease

## 2015-11-14 ENCOUNTER — Ambulatory Visit (INDEPENDENT_AMBULATORY_CARE_PROVIDER_SITE_OTHER): Payer: BLUE CROSS/BLUE SHIELD | Admitting: Pulmonary Disease

## 2015-11-14 VITALS — BP 122/78 | HR 63 | Ht 67.0 in | Wt 153.0 lb

## 2015-11-14 DIAGNOSIS — R0600 Dyspnea, unspecified: Secondary | ICD-10-CM | POA: Diagnosis not present

## 2015-11-14 NOTE — Patient Instructions (Signed)
His CT was reviewed with you today. He did not show any major abnormality except for some few small pulmonary nodules. We will follow up with repeat CT scan without contrast in a year. Follow-up in a year after the CT scan.  We will schedule you for TSH, free T3, T4, Thyroid ultrasound. I will also refer you back to her primary care physician for follow-up of the studies.

## 2015-11-14 NOTE — Progress Notes (Signed)
Subjective:    Patient ID: Crystal Anthony, female    DOB: 03/23/1951, 65 y.o.   MRN: [id number]  Shortness of Breath    Follow up for dyspnea, wheezing, abnormal chest x-ray.  Crystal Anthony is a 65 year old with no significant past medical history. She complains of cough, wheezing since December. She had an episode of confirmed flu in January and was treated with Tamiflu. She's been noticing an increased dyspnea on exertion for the past few months. The cough is nonproductive in nature, no fevers, chills.  She had a chest x-ray done at primary care office which showed hyperinflation consistent with COPD. She's never been on inhalers in the past. She was given a sample of Spiriva at the last visit. She took this only for 2 days and then forgot to continue it. She says most of her symptoms have improved however she persists with dyspnea on exertion. She does not have any allergy symptoms, diagnosis of asthma.  Her PFTs. Showed flattening of the expiratory limb suggestive of variable intrathoracic obstruction. I suggest a bronchoscope but she declined because of the co pay involved. She will get on medicare next year and wants to wait until then. We decided to go ahead with a CT scan of the chest in the meantime.  DATA: CXR 07/19/15 Mild hyperinflation consistent with reactive airway disease or COPD. No acute cardiopulmonary abnormality. Image reviewed  CT chest 11/08/15 No airway abnormality identified. Subcentimeter pulmonary nodules. Thyroid nodule. Images reviewed.  PFTs 10/08/15 FVC 2.83 (79%) FEV1 2.16 (79%) F/F 77 TLC 148% RV/TLC 157% DLCO 65%. Minimal obstructive disease, hyperinflation.   A1AT 10/08/15 130 (normal), PI*MM phenotype  Social History: Occasional smoker, no alcohol, drug use.  Family History: Father-bone cancer, kidney cancer. Mother-ovarian cancer  Past Medical History  Diagnosis Date  . No pertinent past medical history   . DDD (degenerative disc disease),  cervical   . Osteopenia   . Wears glasses   . Family history of ovarian cancer   . Family history of prostate cancer   . Family history of kidney cancer     Current outpatient prescriptions:  .  amphetamine-dextroamphetamine (ADDERALL) 10 MG tablet, Take 10 mg by mouth daily., Disp: , Rfl: 0 .  calcium carbonate (OS-CAL - DOSED IN MG OF ELEMENTAL CALCIUM) 1250 MG tablet, Take 1 tablet by mouth daily., Disp: , Rfl:  .  celecoxib (CELEBREX) 200 MG capsule, Take 200 mg by mouth daily as needed., Disp: , Rfl: 2 .  cholecalciferol (VITAMIN D) 1000 UNITS tablet, Take 1,000 Units by mouth daily., Disp: , Rfl:  .  Glucosamine-Chondroit-Vit C-Mn (GLUCOSAMINE 1500 COMPLEX) CAPS, Take 1 capsule by mouth daily., Disp: , Rfl:  .  ibandronate (BONIVA) 150 MG tablet, Take 150 mg by mouth every 30 (thirty) days. Take in the morning with a full glass of water, on an empty stomach, and do not take anything else by mouth or lie down for the next 30 min., Disp: , Rfl:  .  MINIVELLE 0.075 MG/24HR, Apply 1 patch twice weekly, Disp: , Rfl: 6 .  Multiple Vitamin (MULTIVITAMIN) tablet, Take 1 tablet by mouth daily., Disp: , Rfl:  .  OVER THE COUNTER MEDICATION, Tumeric 200mg  once daily, Servitol once daily, Focus Factor once daily, Disp: , Rfl:  .  vitamin B-12 (CYANOCOBALAMIN) 1000 MCG tablet, Take 1,000 mcg by mouth daily., Disp: , Rfl:   Review of Systems  Respiratory: Positive for shortness of breath.    No cough, wheezing. Positive  dyspnea on exertion. No fevers, chills, hemoptysis. No chest pain, palpitations. No nausea, vomiting, diarrhea, constipation. All other review of systems are negative    Objective:   Physical Exam Blood pressure 122/78, pulse 63, height 5\' 7"  (1.702 m), weight 153 lb (69.4 kg), SpO2 100 %. Gen: No apparent distress Neuro: No gross focal deficits. Neck: No JVD, lymphadenopathy, thyromegaly. RS: Clear, No wheeze or crackles CVS: S1-S2 heard, no murmurs rubs gallops. Abdomen:  Soft, positive bowel sounds. Extremities: No edema. Assessment & Plan:  Evaluation for DOE. Mild COPD  She is doing well on the Spiriva with improvement of her symptoms. She likely has mild COPD from intermittent smoking. The CT scan was reviewed with her. There is no evidence of malignancy although she has some subcentimeter pulmonary nodules. Given her low risk of malignancy we will follow this up with a CT scan in 1 year.  She has a thyroid nodule seen on CT chest. She'll follow up with her primary care for further follow-up.  Plan: - Continue spiriva - Follow up CT in 1 year.  Marshell Garfinkel MD [facility] Pager [phone] If no answer or after 3pm call: [phone] 11/14/2015, 11:31 AM

## 2015-11-19 ENCOUNTER — Telehealth: Payer: Self-pay | Admitting: Pulmonary Disease

## 2015-11-19 NOTE — Telephone Encounter (Signed)
Spoke with the pt  She states that her PCP is out for 2 wks, and so she is wanting to come here and have thyroid labs done  Is this okay? Also, do you want to go ahead and order u/s or can this wait for her PCP in 2 wks? Please advise, thanks!

## 2015-11-20 NOTE — Telephone Encounter (Signed)
LMTCB

## 2015-11-20 NOTE — Telephone Encounter (Signed)
The thyroid tests can wait for 2 weeks. I rather have the PCP order the tests. That way he can follow up if needed. Thanks

## 2015-11-20 NOTE — Telephone Encounter (Signed)
Spoke with pt and advised. Nothing further needed.  

## 2015-11-20 NOTE — Telephone Encounter (Signed)
Patient returning our call. Can be reached at [phone]

## 2015-12-17 DIAGNOSIS — D1801 Hemangioma of skin and subcutaneous tissue: Secondary | ICD-10-CM | POA: Diagnosis not present

## 2015-12-17 DIAGNOSIS — L819 Disorder of pigmentation, unspecified: Secondary | ICD-10-CM | POA: Diagnosis not present

## 2015-12-17 DIAGNOSIS — D2272 Melanocytic nevi of left lower limb, including hip: Secondary | ICD-10-CM | POA: Diagnosis not present

## 2015-12-17 DIAGNOSIS — D235 Other benign neoplasm of skin of trunk: Secondary | ICD-10-CM | POA: Diagnosis not present

## 2015-12-17 DIAGNOSIS — L821 Other seborrheic keratosis: Secondary | ICD-10-CM | POA: Diagnosis not present

## 2015-12-17 DIAGNOSIS — D2262 Melanocytic nevi of left upper limb, including shoulder: Secondary | ICD-10-CM | POA: Diagnosis not present

## 2015-12-25 DIAGNOSIS — F909 Attention-deficit hyperactivity disorder, unspecified type: Secondary | ICD-10-CM | POA: Diagnosis not present

## 2015-12-25 DIAGNOSIS — Z6823 Body mass index (BMI) 23.0-23.9, adult: Secondary | ICD-10-CM | POA: Diagnosis not present

## 2015-12-25 DIAGNOSIS — M859 Disorder of bone density and structure, unspecified: Secondary | ICD-10-CM | POA: Diagnosis not present

## 2015-12-25 DIAGNOSIS — R5383 Other fatigue: Secondary | ICD-10-CM | POA: Diagnosis not present

## 2015-12-25 DIAGNOSIS — N958 Other specified menopausal and perimenopausal disorders: Secondary | ICD-10-CM | POA: Diagnosis not present

## 2016-01-06 DIAGNOSIS — Z6823 Body mass index (BMI) 23.0-23.9, adult: Secondary | ICD-10-CM | POA: Diagnosis not present

## 2016-01-06 DIAGNOSIS — F329 Major depressive disorder, single episode, unspecified: Secondary | ICD-10-CM | POA: Diagnosis not present

## 2016-01-06 DIAGNOSIS — Z1389 Encounter for screening for other disorder: Secondary | ICD-10-CM | POA: Diagnosis not present

## 2016-01-20 ENCOUNTER — Ambulatory Visit: Payer: BLUE CROSS/BLUE SHIELD | Admitting: Pulmonary Disease

## 2016-03-04 DIAGNOSIS — M859 Disorder of bone density and structure, unspecified: Secondary | ICD-10-CM | POA: Diagnosis not present

## 2016-03-04 DIAGNOSIS — F329 Major depressive disorder, single episode, unspecified: Secondary | ICD-10-CM | POA: Diagnosis not present

## 2016-03-04 DIAGNOSIS — Z0000 Encounter for general adult medical examination without abnormal findings: Secondary | ICD-10-CM | POA: Diagnosis not present

## 2016-03-11 DIAGNOSIS — N958 Other specified menopausal and perimenopausal disorders: Secondary | ICD-10-CM | POA: Diagnosis not present

## 2016-03-11 DIAGNOSIS — Z0000 Encounter for general adult medical examination without abnormal findings: Secondary | ICD-10-CM | POA: Diagnosis not present

## 2016-03-11 DIAGNOSIS — Z1389 Encounter for screening for other disorder: Secondary | ICD-10-CM | POA: Diagnosis not present

## 2016-03-11 DIAGNOSIS — Z6823 Body mass index (BMI) 23.0-23.9, adult: Secondary | ICD-10-CM | POA: Diagnosis not present

## 2016-03-11 DIAGNOSIS — M859 Disorder of bone density and structure, unspecified: Secondary | ICD-10-CM | POA: Diagnosis not present

## 2016-03-11 DIAGNOSIS — F329 Major depressive disorder, single episode, unspecified: Secondary | ICD-10-CM | POA: Diagnosis not present

## 2016-03-11 DIAGNOSIS — F909 Attention-deficit hyperactivity disorder, unspecified type: Secondary | ICD-10-CM | POA: Diagnosis not present

## 2016-03-12 DIAGNOSIS — H5203 Hypermetropia, bilateral: Secondary | ICD-10-CM | POA: Diagnosis not present

## 2016-03-12 DIAGNOSIS — H524 Presbyopia: Secondary | ICD-10-CM | POA: Diagnosis not present

## 2016-03-12 DIAGNOSIS — H25813 Combined forms of age-related cataract, bilateral: Secondary | ICD-10-CM | POA: Diagnosis not present

## 2016-03-12 DIAGNOSIS — H52223 Regular astigmatism, bilateral: Secondary | ICD-10-CM | POA: Diagnosis not present

## 2016-03-13 DIAGNOSIS — Z1212 Encounter for screening for malignant neoplasm of rectum: Secondary | ICD-10-CM | POA: Diagnosis not present

## 2016-04-08 DIAGNOSIS — N3945 Continuous leakage: Secondary | ICD-10-CM | POA: Diagnosis not present

## 2016-05-20 DIAGNOSIS — N3281 Overactive bladder: Secondary | ICD-10-CM | POA: Diagnosis not present

## 2016-05-20 DIAGNOSIS — R32 Unspecified urinary incontinence: Secondary | ICD-10-CM | POA: Diagnosis not present

## 2016-06-16 DIAGNOSIS — Z6823 Body mass index (BMI) 23.0-23.9, adult: Secondary | ICD-10-CM | POA: Diagnosis not present

## 2016-06-16 DIAGNOSIS — Z90712 Acquired absence of cervix with remaining uterus: Secondary | ICD-10-CM | POA: Diagnosis not present

## 2016-06-16 DIAGNOSIS — N958 Other specified menopausal and perimenopausal disorders: Secondary | ICD-10-CM | POA: Diagnosis not present

## 2016-06-16 DIAGNOSIS — M8588 Other specified disorders of bone density and structure, other site: Secondary | ICD-10-CM | POA: Diagnosis not present

## 2016-06-16 DIAGNOSIS — Z124 Encounter for screening for malignant neoplasm of cervix: Secondary | ICD-10-CM | POA: Diagnosis not present

## 2016-06-16 DIAGNOSIS — Z1272 Encounter for screening for malignant neoplasm of vagina: Secondary | ICD-10-CM | POA: Diagnosis not present

## 2016-07-08 DIAGNOSIS — N398 Other specified disorders of urinary system: Secondary | ICD-10-CM | POA: Diagnosis not present

## 2016-07-08 DIAGNOSIS — N393 Stress incontinence (female) (male): Secondary | ICD-10-CM | POA: Diagnosis not present

## 2016-07-08 DIAGNOSIS — N3946 Mixed incontinence: Secondary | ICD-10-CM | POA: Diagnosis not present

## 2016-07-22 DIAGNOSIS — N281 Cyst of kidney, acquired: Secondary | ICD-10-CM | POA: Diagnosis not present

## 2016-07-22 DIAGNOSIS — N3281 Overactive bladder: Secondary | ICD-10-CM | POA: Diagnosis not present

## 2016-07-22 DIAGNOSIS — N3946 Mixed incontinence: Secondary | ICD-10-CM | POA: Diagnosis not present

## 2016-09-02 DIAGNOSIS — N3281 Overactive bladder: Secondary | ICD-10-CM | POA: Diagnosis not present

## 2017-01-06 DIAGNOSIS — N3281 Overactive bladder: Secondary | ICD-10-CM | POA: Diagnosis not present

## 2017-03-17 DIAGNOSIS — N3281 Overactive bladder: Secondary | ICD-10-CM | POA: Diagnosis not present

## 2017-05-07 DIAGNOSIS — M859 Disorder of bone density and structure, unspecified: Secondary | ICD-10-CM | POA: Diagnosis not present

## 2017-05-07 DIAGNOSIS — Z79899 Other long term (current) drug therapy: Secondary | ICD-10-CM | POA: Diagnosis not present

## 2017-05-07 DIAGNOSIS — R82998 Other abnormal findings in urine: Secondary | ICD-10-CM | POA: Diagnosis not present

## 2017-05-12 DIAGNOSIS — M859 Disorder of bone density and structure, unspecified: Secondary | ICD-10-CM | POA: Diagnosis not present

## 2017-05-12 DIAGNOSIS — Z1389 Encounter for screening for other disorder: Secondary | ICD-10-CM | POA: Diagnosis not present

## 2017-05-12 DIAGNOSIS — Z6823 Body mass index (BMI) 23.0-23.9, adult: Secondary | ICD-10-CM | POA: Diagnosis not present

## 2017-05-12 DIAGNOSIS — F908 Attention-deficit hyperactivity disorder, other type: Secondary | ICD-10-CM | POA: Diagnosis not present

## 2017-05-12 DIAGNOSIS — Z0000 Encounter for general adult medical examination without abnormal findings: Secondary | ICD-10-CM | POA: Diagnosis not present

## 2017-05-12 DIAGNOSIS — F3289 Other specified depressive episodes: Secondary | ICD-10-CM | POA: Diagnosis not present

## 2017-05-24 DIAGNOSIS — Z1212 Encounter for screening for malignant neoplasm of rectum: Secondary | ICD-10-CM | POA: Diagnosis not present

## 2017-05-24 DIAGNOSIS — Z1211 Encounter for screening for malignant neoplasm of colon: Secondary | ICD-10-CM | POA: Diagnosis not present

## 2017-06-10 DIAGNOSIS — M5136 Other intervertebral disc degeneration, lumbar region: Secondary | ICD-10-CM | POA: Diagnosis not present

## 2017-06-10 DIAGNOSIS — M47812 Spondylosis without myelopathy or radiculopathy, cervical region: Secondary | ICD-10-CM | POA: Diagnosis not present

## 2017-06-10 DIAGNOSIS — M545 Low back pain: Secondary | ICD-10-CM | POA: Diagnosis not present

## 2017-06-10 DIAGNOSIS — M542 Cervicalgia: Secondary | ICD-10-CM | POA: Diagnosis not present

## 2017-07-08 DIAGNOSIS — Z6824 Body mass index (BMI) 24.0-24.9, adult: Secondary | ICD-10-CM | POA: Diagnosis not present

## 2017-07-08 DIAGNOSIS — R05 Cough: Secondary | ICD-10-CM | POA: Diagnosis not present

## 2017-07-08 DIAGNOSIS — J209 Acute bronchitis, unspecified: Secondary | ICD-10-CM | POA: Diagnosis not present

## 2017-07-08 DIAGNOSIS — J309 Allergic rhinitis, unspecified: Secondary | ICD-10-CM | POA: Diagnosis not present

## 2017-07-12 ENCOUNTER — Ambulatory Visit: Payer: BLUE CROSS/BLUE SHIELD | Admitting: Adult Health

## 2017-08-02 DIAGNOSIS — Z6824 Body mass index (BMI) 24.0-24.9, adult: Secondary | ICD-10-CM | POA: Diagnosis not present

## 2017-08-02 DIAGNOSIS — Z418 Encounter for other procedures for purposes other than remedying health state: Secondary | ICD-10-CM | POA: Diagnosis not present

## 2017-08-02 DIAGNOSIS — J309 Allergic rhinitis, unspecified: Secondary | ICD-10-CM | POA: Diagnosis not present

## 2017-08-25 DIAGNOSIS — N3281 Overactive bladder: Secondary | ICD-10-CM | POA: Diagnosis not present

## 2017-09-08 DIAGNOSIS — Z6824 Body mass index (BMI) 24.0-24.9, adult: Secondary | ICD-10-CM | POA: Diagnosis not present

## 2017-09-08 DIAGNOSIS — Z01419 Encounter for gynecological examination (general) (routine) without abnormal findings: Secondary | ICD-10-CM | POA: Diagnosis not present

## 2017-09-13 ENCOUNTER — Encounter: Payer: Self-pay | Admitting: Pulmonary Disease

## 2017-09-13 ENCOUNTER — Ambulatory Visit (INDEPENDENT_AMBULATORY_CARE_PROVIDER_SITE_OTHER): Payer: Medicare Other | Admitting: Pulmonary Disease

## 2017-09-13 ENCOUNTER — Ambulatory Visit: Payer: Medicare Other | Admitting: Pulmonary Disease

## 2017-09-13 VITALS — BP 124/68 | HR 93 | Ht 67.0 in | Wt 155.6 lb

## 2017-09-13 DIAGNOSIS — R0600 Dyspnea, unspecified: Secondary | ICD-10-CM

## 2017-09-13 LAB — NITRIC OXIDE: 11800305105: 17

## 2017-09-13 MED ORDER — BUDESONIDE-FORMOTEROL FUMARATE 160-4.5 MCG/ACT IN AERO: 2.0000 | INHALATION_SPRAY | Freq: Two times a day (BID) | RESPIRATORY_TRACT | 0 refills | Status: DC

## 2017-09-13 MED ORDER — BUDESONIDE-FORMOTEROL FUMARATE 160-4.5 MCG/ACT IN AERO: 2.0000 | INHALATION_SPRAY | Freq: Two times a day (BID) | RESPIRATORY_TRACT | 5 refills | Status: DC

## 2017-09-13 NOTE — Patient Instructions (Signed)
I suspect you have persistent cough and inflammation after your recent bronchitis We will give you a sample of Symbicort inhaler.  Use this for the next 2 weeks Please let us know if his symptoms do not improve with this  Follow-up in 1-2 months.

## 2017-09-13 NOTE — Progress Notes (Signed)
Crystal Anthony    [id number]    Dec 08, 1950  Primary Care Physician:Grewal, Sharyn Lull, MD  Referring Physician: Dian Queen, [address] [place], [place] [address]  Chief complaint: Follow-up for cough  HPI: [name] is a 67 year old with no significant past medical history.  Last seen in 2017 for persistent cough after an episode of flu.  This improved with Spiriva.  Chest x-ray at that time showed hyperinflation suggestive of COPD/reactive airway disease however there is no obstruction on PFTs.  She had remained asymptomatic until 2019 when she had an episode of bronchitis in February 2019 treated with antibiotics by primary care with improvement but she has persistent cough, chest congestion and is here for evaluation. 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 calcium carbonate (OS-CAL - DOSED IN MG OF ELEMENTAL CALCIUM) 1250 MG tablet Take 1 tablet by mouth daily.  . cholecalciferol (VITAMIN D) 1000 UNITS tablet Take 1,000 Units by mouth daily.  . Glucosamine-Chondroit-Vit C-Mn (GLUCOSAMINE 1500 COMPLEX) CAPS Take 1 capsule by mouth daily.  Marland Kitchen ibandronate (BONIVA) 150 MG tablet Take 150 mg by mouth every 30 (thirty) days. Take in the morning with a full glass of water, on an empty stomach, and do not take anything else by mouth or lie down for the next 30 min.  Marland Kitchen MINIVELLE 0.075 MG/24HR Apply 1 patch twice weekly  . Multiple Vitamin (MULTIVITAMIN) tablet Take 1 tablet by mouth daily.  Marland Kitchen OVER THE COUNTER MEDICATION Tumeric 200mg  once daily, Servitol once daily, Focus Factor once daily  . vitamin B-12 (CYANOCOBALAMIN) 1000 MCG tablet Take 1,000 mcg by mouth daily.   Marland Kitchen amphetamine-dextroamphetamine (ADDERALL) 10 MG tablet Take 10 mg by mouth daily.  . celecoxib (CELEBREX) 200 MG capsule Take 200 mg by mouth daily as needed.   No facility-administered encounter medications on file as of 09/13/2017.     Allergies as of 09/13/2017  . (No Known Allergies)    Past Medical History:  Diagnosis Date  . DDD (degenerative disc disease), cervical   . Family history of kidney cancer   . Family history of ovarian cancer   . Family history of prostate cancer   . No pertinent past medical history   . Osteopenia   . Wears glasses     Past Surgical History:  Procedure Laterality Date  . ABDOMINAL HYSTERECTOMY  1996  . BILATERAL SALPINGOOPHORECTOMY  2010  . MASS EXCISION  06/14/2012   Procedure: EXCISION MASS;  Surgeon: Tennis Must, MD;  Location: [facility];  Service: Orthopedics;  Laterality: Right;  index finger  . TONSILLECTOMY      Family History  Problem Relation Age of Onset  . Ovarian cancer Mother 55  . Kidney cancer Father 73  . Bone cancer Father 58       mets?  . Kidney cancer Maternal Uncle        dx in his 61s  . Colon polyps Paternal Aunt   . Heart disease Paternal Aunt   . Prostate cancer Paternal Uncle   . Heart disease Maternal Grandmother   . Heart disease Paternal Grandmother   . Heart disease Paternal Grandfather   . Prostate cancer  Maternal Uncle        dx in his 57s  . Prostate cancer Maternal Uncle        dx in his 24s  . Cancer Paternal Uncle        NOS    Social History   Socioeconomic History  . Marital status: Married    Spouse name: Not on file  . Number of children: 2  . Years of education: Not on file  . Highest education level: Not on file  Occupational History  . Not on file  Social Needs  . Financial resource strain: Not on file  . Food insecurity:    Worry: Not on file    Inability: Not on file  . Transportation needs:    Medical: Not on file    Non-medical: Not on file  Tobacco  Use  . Smoking status: Never Smoker  . Smokeless tobacco: Never Used  . Tobacco comment: socially in college  Substance and Sexual Activity  . Alcohol use: Yes    Alcohol/week: 0.0 oz    Comment: 1 glass of wine per week  . Drug use: No  . Sexual activity: Not on file  Lifestyle  . Physical activity:    Days per week: Not on file    Minutes per session: Not on file  . Stress: Not on file  Relationships  . Social connections:    Talks on phone: Not on file    Gets together: Not on file    Attends religious service: Not on file    Active member of club or organization: Not on file    Attends meetings of clubs or organizations: Not on file    Relationship status: Not on file  . Intimate partner violence:    Fear of current or ex partner: Not on file    Emotionally abused: Not on file    Physically abused: Not on file    Forced sexual activity: Not on file  Other Topics Concern  . 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  HENT: Negative.   Eyes: Negative for blurred vision.  Respiratory: as per HPI  Cardiovascular: Negative for chest pain and palpitations.  Gastrointestinal: Negative for vomiting, diarrhea, blood per rectum. Genitourinary: Negative for dysuria, urgency, frequency and hematuria.  Musculoskeletal: Negative for myalgias, back pain and joint pain.  Skin: Negative for itching and rash.  Neurological: Negative for dizziness, tremors, focal weakness, seizures and loss of consciousness.  Endo/Heme/Allergies: Negative for environmental allergies.  Psychiatric/Behavioral: Negative for depression, suicidal ideas and hallucinations.  All other systems reviewed and are negative.  Physical Exam: Blood pressure 124/68, pulse 93, height 5\' 7"  (1.702 m),  weight 155 lb 9.6 oz (70.6 kg), SpO2 96 %. Gen:      No acute distress HEENT:  EOMI, sclera anicteric Neck:     No masses; no thyromegaly Lungs:    Clear to auscultation bilaterally; normal respiratory effort CV:         Regular rate and rhythm; no murmurs Abd:      + bowel sounds; soft, non-tender; no palpable masses, no distension Ext:    No edema; adequate peripheral perfusion Skin:      Warm and dry; no rash Neuro: alert and oriented x 3  Psych: normal mood and affect  Data Reviewed: CXR 07/19/15 Mild hyperinflation consistent with reactive airway disease or COPD. No acute cardiopulmonary abnormality. Image reviewed  CT chest 11/08/15 No airway abnormality identified. Subcentimeter pulmonary nodules. Thyroid nodule. Images reviewed.  PFTs 10/08/15 FVC 2.83 (79%), FEV1 2.16 (79%), F/F 77, TLC 148%, RV/TLC 157%, DLCO 65%. Minimal obstructive disease, hyperinflation.   A1AT 10/08/15 130 (normal), PI*MM phenotype  FENO 09/13/17-17  Assessment:  Follow-up for cough Likely has persistent inflammation, mild reactive airway disease from her recent episode of bronchitis Suspicion for asthma is low. We will give her a sample of Symbicort to use for the next 2 weeks.  Reassess in 1-2 months.  If the persistent symptoms then will schedule full pulmonary function test and check blood allergy profile and CBC for eosinophilia.  Her last PFTs showed flattening of the expiratory limb suggestive of variable intrathoracic obstruction.  Discussed at last visit and bronchoscope was deferred. CT scan did not show any abnormality.  Plan/Recommendations: -Sample of Symbicort inhaler  Marshell Garfinkel MD [facility] 09/13/2017, 4:09 PM  CC: Dian Queen, MD

## 2017-09-20 ENCOUNTER — Telehealth: Payer: Self-pay

## 2017-09-20 NOTE — Telephone Encounter (Signed)
I called [phone], [facility],  for PA on Symbicort 160. The alternatives that they cover are listed below. Dr. Vaughan Browner do you want to switch to one of these?  [facility],  [facility]

## 2017-09-22 MED ORDER — FLUTICASONE FUROATE-VILANTEROL 200-25 MCG/INH IN AEPB: 1.0000 | INHALATION_SPRAY | Freq: Every day | RESPIRATORY_TRACT | 2 refills | Status: AC

## 2017-09-22 NOTE — Telephone Encounter (Signed)
Called and spoke to pt.  Pt feels that Symbicort is effective. Pt agreed to switch to Breo 200. Rx for Breo 200 has been sent to preferred pharmacy. Nothing further is needed.

## 2017-09-22 NOTE — Telephone Encounter (Signed)
I wanted to give a sample of symbicort- not a prescription. She will call if she wants to continue the inhaler. If so then call in a  prescription then give Breo 200.

## 2017-12-08 DIAGNOSIS — N3946 Mixed incontinence: Secondary | ICD-10-CM | POA: Diagnosis not present

## 2018-01-19 DIAGNOSIS — H25813 Combined forms of age-related cataract, bilateral: Secondary | ICD-10-CM | POA: Diagnosis not present

## 2018-01-19 DIAGNOSIS — H5203 Hypermetropia, bilateral: Secondary | ICD-10-CM | POA: Diagnosis not present

## 2018-01-19 DIAGNOSIS — H52223 Regular astigmatism, bilateral: Secondary | ICD-10-CM | POA: Diagnosis not present

## 2018-01-19 DIAGNOSIS — H3540 Unspecified peripheral retinal degeneration: Secondary | ICD-10-CM | POA: Diagnosis not present

## 2018-01-19 DIAGNOSIS — H11153 Pinguecula, bilateral: Secondary | ICD-10-CM | POA: Diagnosis not present

## 2018-05-11 DIAGNOSIS — Z79899 Other long term (current) drug therapy: Secondary | ICD-10-CM | POA: Diagnosis not present

## 2018-05-11 DIAGNOSIS — M859 Disorder of bone density and structure, unspecified: Secondary | ICD-10-CM | POA: Diagnosis not present

## 2018-05-11 DIAGNOSIS — R82998 Other abnormal findings in urine: Secondary | ICD-10-CM | POA: Diagnosis not present

## 2018-05-18 DIAGNOSIS — Z0000 Encounter for general adult medical examination without abnormal findings: Secondary | ICD-10-CM | POA: Diagnosis not present

## 2018-05-18 DIAGNOSIS — M859 Disorder of bone density and structure, unspecified: Secondary | ICD-10-CM | POA: Diagnosis not present

## 2018-05-18 DIAGNOSIS — Z6824 Body mass index (BMI) 24.0-24.9, adult: Secondary | ICD-10-CM | POA: Diagnosis not present

## 2018-05-18 DIAGNOSIS — Z1389 Encounter for screening for other disorder: Secondary | ICD-10-CM | POA: Diagnosis not present

## 2018-05-18 DIAGNOSIS — R748 Abnormal levels of other serum enzymes: Secondary | ICD-10-CM | POA: Diagnosis not present

## 2018-07-06 DIAGNOSIS — H57813 Brow ptosis, bilateral: Secondary | ICD-10-CM | POA: Diagnosis not present

## 2018-08-15 DIAGNOSIS — H02831 Dermatochalasis of right upper eyelid: Secondary | ICD-10-CM | POA: Diagnosis not present

## 2018-08-15 DIAGNOSIS — D485 Neoplasm of uncertain behavior of skin: Secondary | ICD-10-CM | POA: Diagnosis not present

## 2018-08-15 DIAGNOSIS — H02834 Dermatochalasis of left upper eyelid: Secondary | ICD-10-CM | POA: Diagnosis not present

## 2018-08-15 DIAGNOSIS — L821 Other seborrheic keratosis: Secondary | ICD-10-CM | POA: Diagnosis not present

## 2018-10-05 DIAGNOSIS — Z1231 Encounter for screening mammogram for malignant neoplasm of breast: Secondary | ICD-10-CM | POA: Diagnosis not present

## 2018-10-05 DIAGNOSIS — Z6823 Body mass index (BMI) 23.0-23.9, adult: Secondary | ICD-10-CM | POA: Diagnosis not present

## 2018-10-05 DIAGNOSIS — M8588 Other specified disorders of bone density and structure, other site: Secondary | ICD-10-CM | POA: Diagnosis not present

## 2018-10-05 DIAGNOSIS — Z124 Encounter for screening for malignant neoplasm of cervix: Secondary | ICD-10-CM | POA: Diagnosis not present

## 2018-10-05 DIAGNOSIS — N958 Other specified menopausal and perimenopausal disorders: Secondary | ICD-10-CM | POA: Diagnosis not present

## 2018-10-10 DIAGNOSIS — R748 Abnormal levels of other serum enzymes: Secondary | ICD-10-CM | POA: Diagnosis not present

## 2018-10-10 DIAGNOSIS — R030 Elevated blood-pressure reading, without diagnosis of hypertension: Secondary | ICD-10-CM | POA: Diagnosis not present

## 2018-10-10 DIAGNOSIS — M8580 Other specified disorders of bone density and structure, unspecified site: Secondary | ICD-10-CM | POA: Diagnosis not present

## 2018-10-10 DIAGNOSIS — Z20818 Contact with and (suspected) exposure to other bacterial communicable diseases: Secondary | ICD-10-CM | POA: Diagnosis not present

## 2018-10-11 DIAGNOSIS — Z20828 Contact with and (suspected) exposure to other viral communicable diseases: Secondary | ICD-10-CM | POA: Diagnosis not present

## 2019-04-04 DIAGNOSIS — H5203 Hypermetropia, bilateral: Secondary | ICD-10-CM | POA: Diagnosis not present

## 2019-04-04 DIAGNOSIS — H524 Presbyopia: Secondary | ICD-10-CM | POA: Diagnosis not present

## 2019-04-04 DIAGNOSIS — H40053 Ocular hypertension, bilateral: Secondary | ICD-10-CM | POA: Diagnosis not present

## 2019-04-04 DIAGNOSIS — H25813 Combined forms of age-related cataract, bilateral: Secondary | ICD-10-CM | POA: Diagnosis not present

## 2019-04-04 DIAGNOSIS — H52223 Regular astigmatism, bilateral: Secondary | ICD-10-CM | POA: Diagnosis not present

## 2019-04-10 DIAGNOSIS — M25552 Pain in left hip: Secondary | ICD-10-CM | POA: Diagnosis not present

## 2019-04-10 DIAGNOSIS — R262 Difficulty in walking, not elsewhere classified: Secondary | ICD-10-CM | POA: Diagnosis not present

## 2019-05-24 DIAGNOSIS — R748 Abnormal levels of other serum enzymes: Secondary | ICD-10-CM | POA: Diagnosis not present

## 2019-05-26 ENCOUNTER — Other Ambulatory Visit: Payer: Self-pay | Admitting: Internal Medicine

## 2019-05-26 DIAGNOSIS — R030 Elevated blood-pressure reading, without diagnosis of hypertension: Secondary | ICD-10-CM

## 2019-05-29 ENCOUNTER — Encounter: Payer: Self-pay | Admitting: Gastroenterology

## 2019-06-19 ENCOUNTER — Ambulatory Visit
Admission: RE | Admit: 2019-06-19 | Discharge: 2019-06-19 | Disposition: A | Discharge status: Home / Self Care | Payer: Medicare Other | Source: Ambulatory Visit | Attending: Internal Medicine | Admitting: Internal Medicine

## 2019-06-19 DIAGNOSIS — R030 Elevated blood-pressure reading, without diagnosis of hypertension: Secondary | ICD-10-CM

## 2019-06-20 ENCOUNTER — Ambulatory Visit (AMBULATORY_SURGERY_CENTER): Payer: Medicare Other | Admitting: *Deleted

## 2019-06-20 ENCOUNTER — Other Ambulatory Visit: Payer: Self-pay

## 2019-06-20 VITALS — Ht 67.75 in | Wt 149.0 lb

## 2019-06-20 DIAGNOSIS — Z01818 Encounter for other preprocedural examination: Secondary | ICD-10-CM

## 2019-06-20 DIAGNOSIS — Z1211 Encounter for screening for malignant neoplasm of colon: Secondary | ICD-10-CM

## 2019-06-20 MED ORDER — NA SULFATE-K SULFATE-MG SULF 17.5-3.13-1.6 GM/177ML PO SOLN: ORAL | 0 refills | Status: DC

## 2019-06-20 NOTE — Progress Notes (Signed)
Pt's previsit is done over the phone and all paperwork (prep instructions, blank consent form to just read over, pre-procedure acknowledgement form and stamped envelope) sent to patient  Pt is aware that care partner will wait in the car during procedure; if they feel like they will be too hot or cold to wait in the car; they may wait in the 4 th floor lobby. Patient is aware to bring only one care partner. We want them to wear a mask (we do not have any that we can provide them), practice social distancing, and we will check their temperatures when they get here.  I did remind the patient that their care partner needs to stay in the parking lot the entire time and have a cell phone available, we will call them when the pt is ready for discharge. Patient will wear mask into building.  .No egg or soy allergy  No home oxygen use or problems with anesthesia  No medications for weight loss taken  emmi information given  covid test 06-29-19 at 3:20 pm Pt instructed if they develop COVID symptoms, they should not go to have their test done; they should contact their PCP.  Also, they are told to let us know if they are sick and testing elsewhere.

## 2019-06-28 ENCOUNTER — Telehealth: Payer: Self-pay | Admitting: Gastroenterology

## 2019-06-28 NOTE — Telephone Encounter (Signed)
Pt is wanting to wait until March to have colonoscopy done.  Recall put in for March 1st.  Also, she states her daughter was around her last weekend and has tested positive for the covid 19.  I explained she will need to contact her PCP in order to get her covid test scheduled.  Understanding voiced.

## 2019-07-04 ENCOUNTER — Encounter: Payer: Medicare Other | Admitting: Gastroenterology

## 2019-08-02 DIAGNOSIS — D2272 Melanocytic nevi of left lower limb, including hip: Secondary | ICD-10-CM | POA: Diagnosis not present

## 2019-08-02 DIAGNOSIS — L821 Other seborrheic keratosis: Secondary | ICD-10-CM | POA: Diagnosis not present

## 2019-08-02 DIAGNOSIS — D225 Melanocytic nevi of trunk: Secondary | ICD-10-CM | POA: Diagnosis not present

## 2019-08-02 DIAGNOSIS — D2372 Other benign neoplasm of skin of left lower limb, including hip: Secondary | ICD-10-CM | POA: Diagnosis not present

## 2019-08-02 DIAGNOSIS — D2261 Melanocytic nevi of right upper limb, including shoulder: Secondary | ICD-10-CM | POA: Diagnosis not present

## 2019-08-02 DIAGNOSIS — D2271 Melanocytic nevi of right lower limb, including hip: Secondary | ICD-10-CM | POA: Diagnosis not present

## 2019-08-02 DIAGNOSIS — D2262 Melanocytic nevi of left upper limb, including shoulder: Secondary | ICD-10-CM | POA: Diagnosis not present

## 2019-08-02 DIAGNOSIS — D2361 Other benign neoplasm of skin of right upper limb, including shoulder: Secondary | ICD-10-CM | POA: Diagnosis not present

## 2019-08-31 DIAGNOSIS — F419 Anxiety disorder, unspecified: Secondary | ICD-10-CM | POA: Diagnosis not present

## 2019-08-31 DIAGNOSIS — F330 Major depressive disorder, recurrent, mild: Secondary | ICD-10-CM | POA: Diagnosis not present

## 2019-08-31 DIAGNOSIS — Z1331 Encounter for screening for depression: Secondary | ICD-10-CM | POA: Diagnosis not present

## 2019-09-22 ENCOUNTER — Encounter: Payer: Self-pay | Admitting: Gastroenterology

## 2019-10-10 ENCOUNTER — Other Ambulatory Visit: Payer: Self-pay

## 2019-10-10 ENCOUNTER — Ambulatory Visit (AMBULATORY_SURGERY_CENTER): Payer: Self-pay | Admitting: *Deleted

## 2019-10-10 VITALS — Temp 96.6°F | Ht 67.75 in | Wt 154.8 lb

## 2019-10-10 DIAGNOSIS — Z1211 Encounter for screening for malignant neoplasm of colon: Secondary | ICD-10-CM

## 2019-10-10 NOTE — Progress Notes (Signed)
2nd dose of covid vaccine greater than 2 weeks ago  Pt has Suprep already at home  Pt is aware that care partner will wait in the car during procedure; if they feel like they will be too hot or cold to wait in the car; they may wait in the 4 th floor lobby. Patient is aware to bring only one care partner. We want them to wear a mask (we do not have any that we can provide them), practice social distancing, and we will check their temperatures when they get here.  I did remind the patient that their care partner needs to stay in the parking lot the entire time and have a cell phone available, we will call them when the pt is ready for discharge. Patient will wear mask into building.   No trouble with anesthesia, difficulty with intubation or hx/fam hx of malignant hyperthermia per pt   No egg or soy allergy  No home oxygen use   No medications for weight loss taken  emmi information given  Pt denies constipation issues

## 2019-10-17 DIAGNOSIS — H5203 Hypermetropia, bilateral: Secondary | ICD-10-CM | POA: Diagnosis not present

## 2019-10-17 DIAGNOSIS — H52223 Regular astigmatism, bilateral: Secondary | ICD-10-CM | POA: Diagnosis not present

## 2019-10-17 DIAGNOSIS — H524 Presbyopia: Secondary | ICD-10-CM | POA: Diagnosis not present

## 2019-10-17 DIAGNOSIS — H40019 Open angle with borderline findings, low risk, unspecified eye: Secondary | ICD-10-CM | POA: Diagnosis not present

## 2019-10-17 DIAGNOSIS — H40059 Ocular hypertension, unspecified eye: Secondary | ICD-10-CM | POA: Diagnosis not present

## 2019-10-24 ENCOUNTER — Ambulatory Visit (AMBULATORY_SURGERY_CENTER): Payer: Medicare Other | Admitting: Gastroenterology

## 2019-10-24 ENCOUNTER — Encounter: Payer: Self-pay | Admitting: Gastroenterology

## 2019-10-24 ENCOUNTER — Other Ambulatory Visit: Payer: Self-pay

## 2019-10-24 VITALS — BP 121/77 | HR 64 | Temp 95.7°F | Resp 20 | Ht 67.75 in | Wt 154.0 lb

## 2019-10-24 DIAGNOSIS — D122 Benign neoplasm of ascending colon: Secondary | ICD-10-CM | POA: Diagnosis not present

## 2019-10-24 DIAGNOSIS — K635 Polyp of colon: Secondary | ICD-10-CM | POA: Diagnosis not present

## 2019-10-24 DIAGNOSIS — Z1211 Encounter for screening for malignant neoplasm of colon: Secondary | ICD-10-CM

## 2019-10-24 MED ORDER — SODIUM CHLORIDE 0.9 % IV SOLN: 500.0000 mL | Freq: Once | INTRAVENOUS | Status: DC

## 2019-10-24 NOTE — Patient Instructions (Signed)
YOU HAD AN ENDOSCOPIC PROCEDURE TODAY AT [facility]:   Refer to the procedure report that was given to you for any specific questions about what was found during the examination.  If the procedure report does not answer your questions, please call your gastroenterologist to clarify.  If you requested that your care partner not be given the details of your procedure findings, then the procedure report has been included in a sealed envelope for you to review at your convenience later.  YOU SHOULD EXPECT: Some feelings of bloating in the abdomen. Passage of more gas than usual.  Walking can help get rid of the air that was put into your GI tract during the procedure and reduce the bloating. If you had a lower endoscopy (such as a colonoscopy or flexible sigmoidoscopy) you may notice spotting of blood in your stool or on the toilet paper. If you underwent a bowel prep for your procedure, you may not have a normal bowel movement for a few days.  Please Note:  You might notice some irritation and congestion in your nose or some drainage.  This is from the oxygen used during your procedure.  There is no need for concern and it should clear up in a day or so.  SYMPTOMS TO REPORT IMMEDIATELY:   Following lower endoscopy (colonoscopy or flexible sigmoidoscopy):  Excessive amounts of blood in the stool  Significant tenderness or worsening of abdominal pains  Swelling of the abdomen that is new, acute  Fever of 100F or higher  For urgent or emergent issues, a gastroenterologist can be reached at any hour by calling [phone]. Do not use MyChart messaging for urgent concerns.    DIET:  We do recommend a small meal at first, but then you may proceed to your regular diet.  Drink plenty of fluids but you should avoid alcoholic beverages for 24 hours.  ACTIVITY:  You should plan to take it easy for the rest of today and you should NOT DRIVE or use heavy machinery until tomorrow (because  of the sedation medicines used during the test).    FOLLOW UP: Our staff will call the number listed on your records 48-72 hours following your procedure to check on you and address any questions or concerns that you may have regarding the information given to you following your procedure. If we do not reach you, we will leave a message.  We will attempt to reach you two times.  During this call, we will ask if you have developed any symptoms of COVID 19. If you develop any symptoms (ie: fever, flu-like symptoms, shortness of breath, cough etc.) before then, please call [phone].  If you test positive for Covid 19 in the 2 weeks post procedure, please call and report this information to [place].    If any biopsies were taken you will be contacted by phone or by letter within the next 1-3 weeks.  Please call us at [phone] if you have not heard about the biopsies in 3 weeks.    SIGNATURES/CONFIDENTIALITY: You and/or your care partner have signed paperwork which will be entered into your electronic medical record.  These signatures attest to the fact that that the information above on your After Visit Summary has been reviewed and is understood.  Full responsibility of the confidentiality of this discharge information lies with you and/or your care-partner.  Await pathology- next colonoscopy in 3-5 years  Please continue your normal medications  Please read over handouts  about polyps, diverticulosis, hemorrhoids and high fiber diets

## 2019-10-24 NOTE — Progress Notes (Signed)
Called to room to assist during endoscopic procedure.  Patient ID and intended procedure confirmed with present staff. Received instructions for my participation in the procedure from the performing physician.  

## 2019-10-24 NOTE — Op Note (Signed)
[facility] Patient Name: Crystal Anthony Procedure Date: 10/24/2019 8:38 AM MRN: [id number] Endoscopist: Mauri Pole , MD Age: 69 Referring MD:  Date of Birth: 11/28/1950 Gender: Female Account #: [id number] Procedure:                Colonoscopy Indications:              Screening for colorectal malignant neoplasm Medicines:                Monitored Anesthesia Care Procedure:                Pre-Anesthesia Assessment:                           - Prior to the procedure, a History and Physical                            was performed, and patient medications and                            allergies were reviewed. The patient's tolerance of                            previous anesthesia was also reviewed. The risks                            and benefits of the procedure and the sedation                            options and risks were discussed with the patient.                            All questions were answered, and informed consent                            was obtained. Prior Anticoagulants: The patient has                            taken no previous anticoagulant or antiplatelet                            agents. ASA Grade Assessment: II - A patient with                            mild systemic disease. After reviewing the risks                            and benefits, the patient was deemed in                            satisfactory condition to undergo the procedure.                           After obtaining informed consent, the colonoscope  was passed under direct vision. Throughout the                            procedure, the patient's blood pressure, pulse, and                            oxygen saturations were monitored continuously. The                            Colonoscope was introduced through the anus and                            advanced to the the cecum, identified by                            appendiceal orifice  and ileocecal valve. The                            colonoscopy was performed without difficulty. The                            patient tolerated the procedure well. The quality                            of the bowel preparation was good. The ileocecal                            valve, appendiceal orifice, and rectum were                            photographed. Scope In: 8:44:43 AM Scope Out: 8:59:29 AM Scope Withdrawal Time: 0 hours 10 minutes 40 seconds  Total Procedure Duration: 0 hours 14 minutes 46 seconds  Findings:                 The perianal and digital rectal examinations were                            normal.                           A 11 mm polyp was found in the ascending colon. The                            polyp was sessile. The polyp was removed with a                            cold snare. Resection and retrieval were complete.                           A few small-mouthed diverticula were found in the                            sigmoid colon, descending colon, transverse colon  and ascending colon.                           Non-bleeding internal hemorrhoids were found during                            retroflexion. The hemorrhoids were small. Complications:            No immediate complications. Estimated Blood Loss:     Estimated blood loss was minimal. Impression:               - One 11 mm polyp in the ascending colon, removed                            with a cold snare. Resected and retrieved.                           - Diverticulosis in the sigmoid colon, in the                            descending colon, in the transverse colon and in                            the ascending colon.                           - Non-bleeding internal hemorrhoids. Recommendation:           - Patient has a contact number available for                            emergencies. The signs and symptoms of potential                            delayed  complications were discussed with the                            patient. Return to normal activities tomorrow.                            Written discharge instructions were provided to the                            patient.                           - Resume previous diet.                           - Continue present medications.                           - Await pathology results.                           - Repeat colonoscopy in 3 - 5 years for  surveillance based on pathology results. Mauri Pole, MD 10/24/2019 9:04:28 AM This report has been signed electronically.

## 2019-10-24 NOTE — Progress Notes (Signed)
A/ox3, pleased with MAC, report to RN 

## 2019-10-24 NOTE — Progress Notes (Signed)
Pt's states no medical or surgical changes since previsit or office visit. 

## 2019-10-26 ENCOUNTER — Telehealth: Payer: Self-pay

## 2019-10-26 ENCOUNTER — Telehealth: Payer: Self-pay | Admitting: *Deleted

## 2019-10-26 ENCOUNTER — Encounter: Payer: Self-pay | Admitting: Gastroenterology

## 2019-10-26 NOTE — Telephone Encounter (Signed)
Follow up phone call attempt, unable to LM °

## 2019-10-26 NOTE — Telephone Encounter (Signed)
  Follow up Call-  Call back number 10/24/2019  Post procedure Call Back phone  # [phone]  Permission to leave phone message Yes  Some recent data might be hidden     Patient questions:  Do you have a fever, pain , or abdominal swelling? No. Pain Score  0 *  Have you tolerated food without any problems? Yes.    Have you been able to return to your normal activities? Yes.    Do you have any questions about your discharge instructions: Diet   No. Medications  No. Follow up visit  No.  Do you have questions or concerns about your Care? No.  Actions: * If pain score is 4 or above: No action needed, pain <4.  1. Have you developed a fever since your procedure? no  2.   Have you had an respiratory symptoms (SOB or cough) since your procedure? no  3.   Have you tested positive for COVID 19 since your procedure no  4.   Have you had any family members/close contacts diagnosed with the COVID 19 since your procedure?  no   If yes to any of these questions please route to Joylene John, RN and Erenest Rasher, RN

## 2019-12-27 DIAGNOSIS — N3946 Mixed incontinence: Secondary | ICD-10-CM | POA: Diagnosis not present

## 2019-12-27 DIAGNOSIS — N3281 Overactive bladder: Secondary | ICD-10-CM | POA: Diagnosis not present

## 2020-01-03 DIAGNOSIS — N3281 Overactive bladder: Secondary | ICD-10-CM | POA: Diagnosis not present

## 2020-02-20 DIAGNOSIS — Z01419 Encounter for gynecological examination (general) (routine) without abnormal findings: Secondary | ICD-10-CM | POA: Diagnosis not present

## 2020-02-20 DIAGNOSIS — Z6824 Body mass index (BMI) 24.0-24.9, adult: Secondary | ICD-10-CM | POA: Diagnosis not present

## 2020-02-21 DIAGNOSIS — M8588 Other specified disorders of bone density and structure, other site: Secondary | ICD-10-CM | POA: Diagnosis not present

## 2020-02-21 DIAGNOSIS — N958 Other specified menopausal and perimenopausal disorders: Secondary | ICD-10-CM | POA: Diagnosis not present

## 2020-03-19 ENCOUNTER — Ambulatory Visit: Payer: Medicare Other | Attending: Internal Medicine

## 2020-03-19 DIAGNOSIS — Z23 Encounter for immunization: Secondary | ICD-10-CM

## 2020-03-19 NOTE — Progress Notes (Signed)
   Covid-19 Vaccination Clinic  Name:  REGGIE WELGE    MRN: [id number] DOB: Mar 05, 1951  03/19/2020  Ms. Savich was observed post Covid-19 immunization for 15 minutes without incident. She was provided with Vaccine Information Sheet and instruction to access the V-Safe system.   Ms. Mcglaun was instructed to call 911 with any severe reactions post vaccine: Marland Kitchen Difficulty breathing  . Swelling of face and throat  . A fast heartbeat  . A bad rash all over body  . Dizziness and weakness      Covid-19 Vaccination Clinic  Name:  XANTHE COUILLARD    MRN: [id number] DOB: Dec 17, 1950  03/19/2020  Ms. Nelligan was observed post Covid-19 immunization for 15 minutes without incident. She was provided with Vaccine Information Sheet and instruction to access the V-Safe system.   Ms. Gelardi was instructed to call 911 with any severe reactions post vaccine: Marland Kitchen Difficulty breathing  . Swelling of face and throat  . A fast heartbeat  . A bad rash all over body  . Dizziness and weakness

## 2020-05-08 DIAGNOSIS — H524 Presbyopia: Secondary | ICD-10-CM | POA: Diagnosis not present

## 2020-05-08 DIAGNOSIS — H25813 Combined forms of age-related cataract, bilateral: Secondary | ICD-10-CM | POA: Diagnosis not present

## 2020-05-08 DIAGNOSIS — H52223 Regular astigmatism, bilateral: Secondary | ICD-10-CM | POA: Diagnosis not present

## 2020-05-08 DIAGNOSIS — H5203 Hypermetropia, bilateral: Secondary | ICD-10-CM | POA: Diagnosis not present

## 2020-05-20 DIAGNOSIS — R7989 Other specified abnormal findings of blood chemistry: Secondary | ICD-10-CM | POA: Diagnosis not present

## 2020-05-20 DIAGNOSIS — M859 Disorder of bone density and structure, unspecified: Secondary | ICD-10-CM | POA: Diagnosis not present

## 2020-05-20 DIAGNOSIS — R5383 Other fatigue: Secondary | ICD-10-CM | POA: Diagnosis not present

## 2020-05-20 DIAGNOSIS — R82998 Other abnormal findings in urine: Secondary | ICD-10-CM | POA: Diagnosis not present

## 2020-05-27 DIAGNOSIS — M5416 Radiculopathy, lumbar region: Secondary | ICD-10-CM | POA: Diagnosis not present

## 2020-05-27 DIAGNOSIS — N958 Other specified menopausal and perimenopausal disorders: Secondary | ICD-10-CM | POA: Diagnosis not present

## 2020-05-27 DIAGNOSIS — F330 Major depressive disorder, recurrent, mild: Secondary | ICD-10-CM | POA: Diagnosis not present

## 2020-05-27 DIAGNOSIS — F909 Attention-deficit hyperactivity disorder, unspecified type: Secondary | ICD-10-CM | POA: Diagnosis not present

## 2020-05-27 DIAGNOSIS — M8580 Other specified disorders of bone density and structure, unspecified site: Secondary | ICD-10-CM | POA: Diagnosis not present

## 2020-05-27 DIAGNOSIS — F419 Anxiety disorder, unspecified: Secondary | ICD-10-CM | POA: Diagnosis not present

## 2020-05-27 DIAGNOSIS — Z1339 Encounter for screening examination for other mental health and behavioral disorders: Secondary | ICD-10-CM | POA: Diagnosis not present

## 2020-05-27 DIAGNOSIS — R030 Elevated blood-pressure reading, without diagnosis of hypertension: Secondary | ICD-10-CM | POA: Diagnosis not present

## 2020-05-27 DIAGNOSIS — Z1331 Encounter for screening for depression: Secondary | ICD-10-CM | POA: Diagnosis not present

## 2020-05-27 DIAGNOSIS — Z0000 Encounter for general adult medical examination without abnormal findings: Secondary | ICD-10-CM | POA: Diagnosis not present

## 2020-05-27 DIAGNOSIS — J309 Allergic rhinitis, unspecified: Secondary | ICD-10-CM | POA: Diagnosis not present

## 2020-08-01 DIAGNOSIS — L821 Other seborrheic keratosis: Secondary | ICD-10-CM | POA: Diagnosis not present

## 2020-08-01 DIAGNOSIS — D2272 Melanocytic nevi of left lower limb, including hip: Secondary | ICD-10-CM | POA: Diagnosis not present

## 2020-08-01 DIAGNOSIS — D1801 Hemangioma of skin and subcutaneous tissue: Secondary | ICD-10-CM | POA: Diagnosis not present

## 2020-08-01 DIAGNOSIS — D2361 Other benign neoplasm of skin of right upper limb, including shoulder: Secondary | ICD-10-CM | POA: Diagnosis not present

## 2020-08-01 DIAGNOSIS — D2372 Other benign neoplasm of skin of left lower limb, including hip: Secondary | ICD-10-CM | POA: Diagnosis not present

## 2020-08-01 DIAGNOSIS — B351 Tinea unguium: Secondary | ICD-10-CM | POA: Diagnosis not present

## 2020-08-01 DIAGNOSIS — D2261 Melanocytic nevi of right upper limb, including shoulder: Secondary | ICD-10-CM | POA: Diagnosis not present

## 2020-08-01 DIAGNOSIS — D2271 Melanocytic nevi of right lower limb, including hip: Secondary | ICD-10-CM | POA: Diagnosis not present

## 2020-08-01 DIAGNOSIS — D225 Melanocytic nevi of trunk: Secondary | ICD-10-CM | POA: Diagnosis not present

## 2020-09-10 ENCOUNTER — Other Ambulatory Visit: Payer: Self-pay | Admitting: Internal Medicine

## 2020-09-10 DIAGNOSIS — E785 Hyperlipidemia, unspecified: Secondary | ICD-10-CM

## 2020-09-13 DIAGNOSIS — N3281 Overactive bladder: Secondary | ICD-10-CM | POA: Diagnosis not present

## 2020-09-13 DIAGNOSIS — N398 Other specified disorders of urinary system: Secondary | ICD-10-CM | POA: Diagnosis not present

## 2020-09-25 DIAGNOSIS — N3281 Overactive bladder: Secondary | ICD-10-CM | POA: Diagnosis not present

## 2020-09-25 DIAGNOSIS — N3941 Urge incontinence: Secondary | ICD-10-CM | POA: Diagnosis not present

## 2020-09-26 ENCOUNTER — Ambulatory Visit
Admission: RE | Admit: 2020-09-26 | Discharge: 2020-09-26 | Disposition: A | Discharge status: Home / Self Care | Payer: No Typology Code available for payment source | Source: Ambulatory Visit | Attending: Internal Medicine | Admitting: Internal Medicine

## 2020-09-26 ENCOUNTER — Other Ambulatory Visit: Payer: Self-pay

## 2020-09-26 DIAGNOSIS — E785 Hyperlipidemia, unspecified: Secondary | ICD-10-CM

## 2020-09-26 DIAGNOSIS — E7800 Pure hypercholesterolemia, unspecified: Secondary | ICD-10-CM | POA: Diagnosis not present

## 2020-11-13 ENCOUNTER — Other Ambulatory Visit (HOSPITAL_BASED_OUTPATIENT_CLINIC_OR_DEPARTMENT_OTHER): Payer: Self-pay

## 2020-11-13 ENCOUNTER — Ambulatory Visit: Payer: Medicare Other | Attending: Internal Medicine

## 2020-11-13 DIAGNOSIS — Z23 Encounter for immunization: Secondary | ICD-10-CM

## 2020-11-13 MED ORDER — PFIZER-BIONT COVID-19 VAC-TRIS 30 MCG/0.3ML IM SUSP
INTRAMUSCULAR | 0 refills | Status: AC
  Filled 2020-11-13: qty 0.3, 1d supply, fill #0

## 2020-11-13 NOTE — Progress Notes (Signed)
   Covid-19 Vaccination Clinic  Name:  Crystal Anthony    MRN: [id number] DOB: [birth year]  11/13/2020  Crystal Anthony was observed post Covid-19 immunization for 15 minutes without incident. She was provided with Vaccine Information Sheet and instruction to access the V-Safe system.   Crystal Anthony was instructed to call 911 with any severe reactions post vaccine: Marland Kitchen Difficulty breathing  . Swelling of face and throat  . A fast heartbeat  . A bad rash all over body  . Dizziness and weakness   Immunizations Administered    Name Date Dose VIS Date Route   PFIZER Comrnaty(Gray TOP) Covid-19 Vaccine 11/13/2020 10:51 AM 0.3 mL 05/16/2020 Intramuscular   Manufacturer: [place]   Lot: [id number]   [place]: [phone]

## 2020-12-09 DIAGNOSIS — Z20822 Contact with and (suspected) exposure to covid-19: Secondary | ICD-10-CM | POA: Diagnosis not present

## 2020-12-12 IMAGING — CT CT HEART SCORING
1 series · 15 of 20 positions shown, 19 images · non-contrast
Comparison: 11/08/2015 chest CT

CLINICAL DATA: Unexplained high blood pressure. Normal cholesterol.

EXAM:
CT HEART FOR CALCIUM SCORING
TECHNIQUE: CT heart was performed using prospective ECG gating.
A non-contrast exam for calcium scoring was performed.
Note that this exam targets the heart and the chest was not imaged
in its entirety.

[Series 2: calcium scoring 2.00 qr36 bestdiast 70% · axial · 0.34mm/px · z∈[+1428,+1568]mm · 15 of 80 slices shown, 19 images]
[im 5/80  vessel]
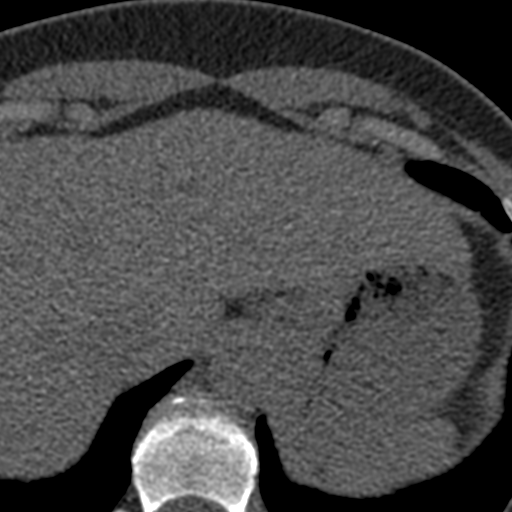
[im 5/80  lung]
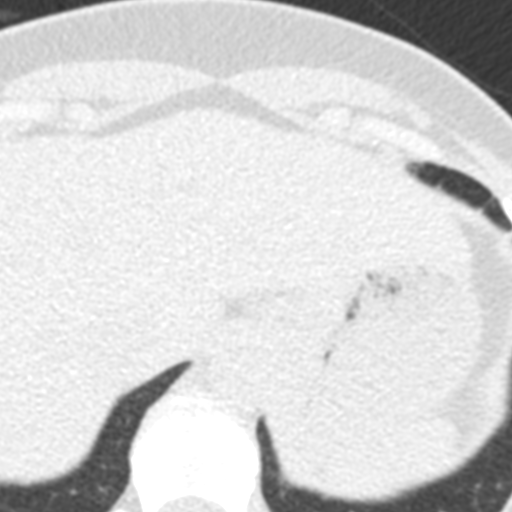
[im 9/80  vessel]
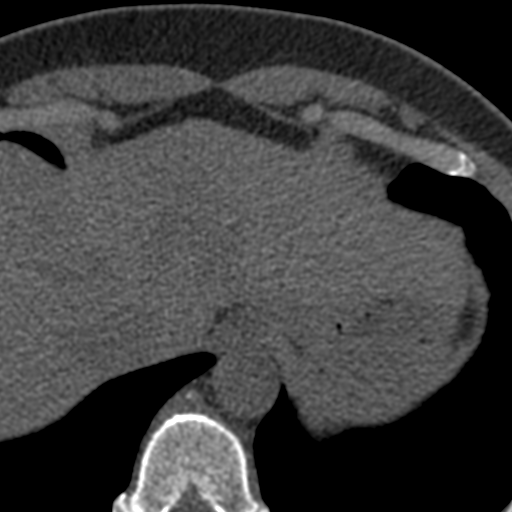
[im 17/80  vessel]
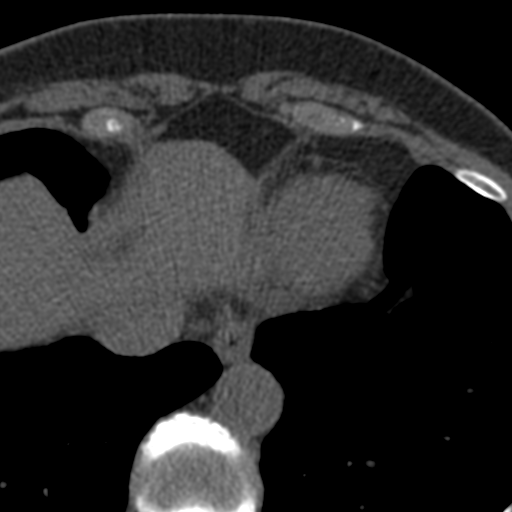
[im 21/80  vessel]
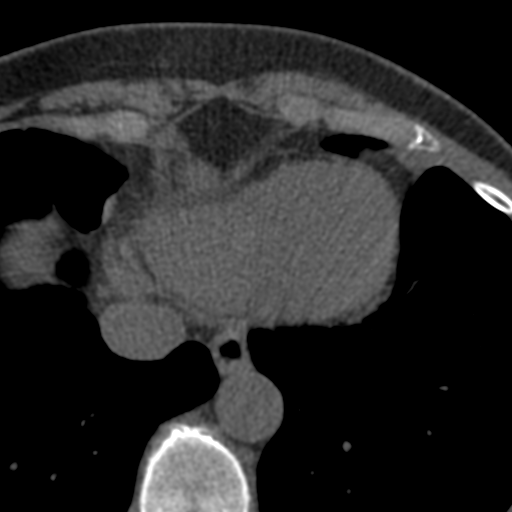
[im 25/80  vessel]
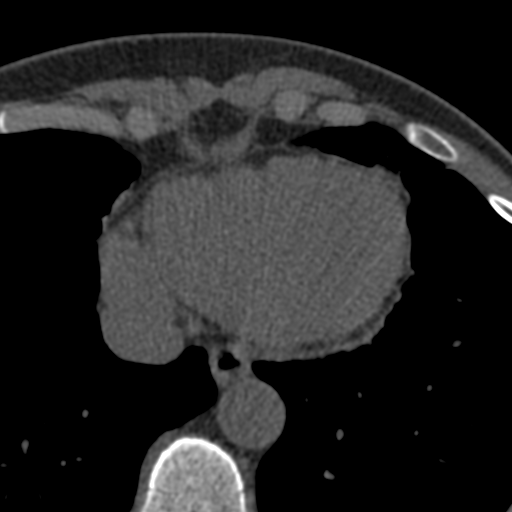
[im 25/80  lung]
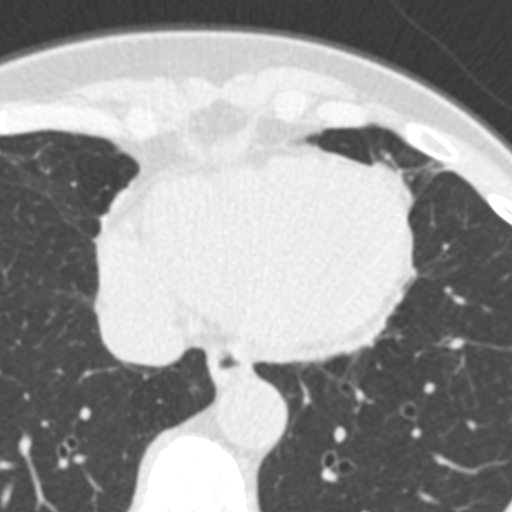
[im 30/80  vessel]
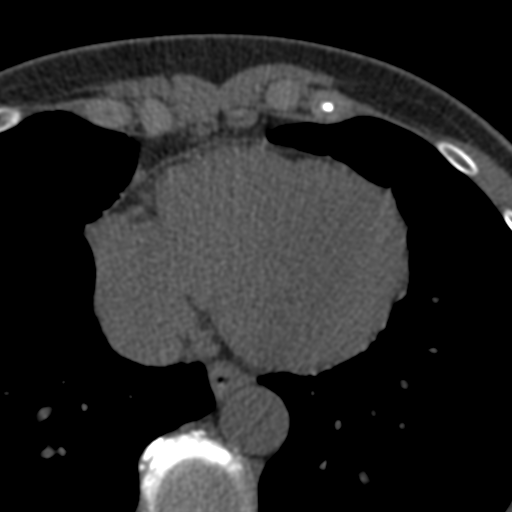
[im 34/80  vessel]
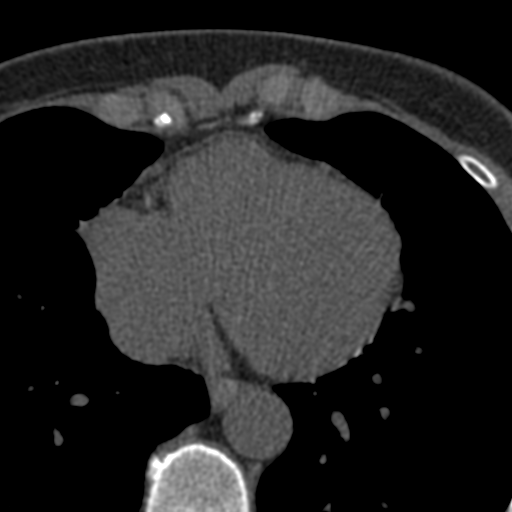
[im 42/80  vessel]
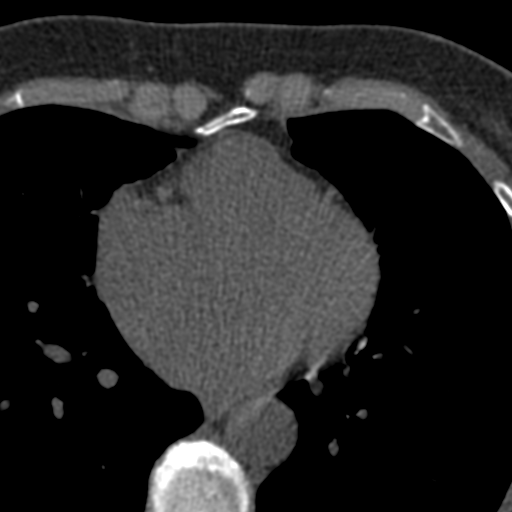
[im 46/80  vessel]
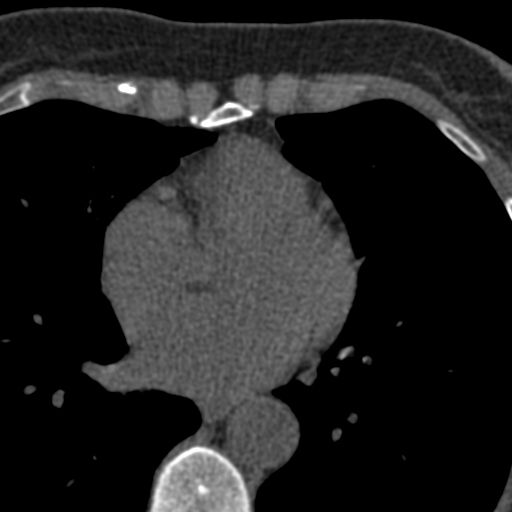
[im 46/80  lung]
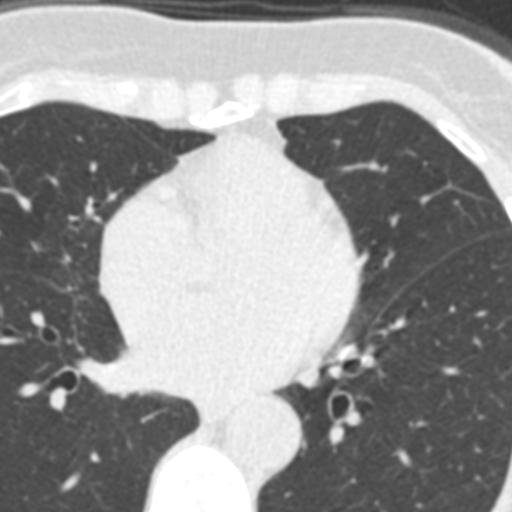
[im 50/80  vessel]
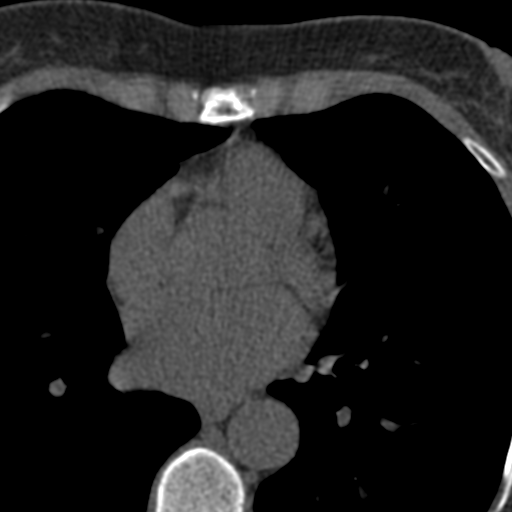
[im 55/80  vessel]
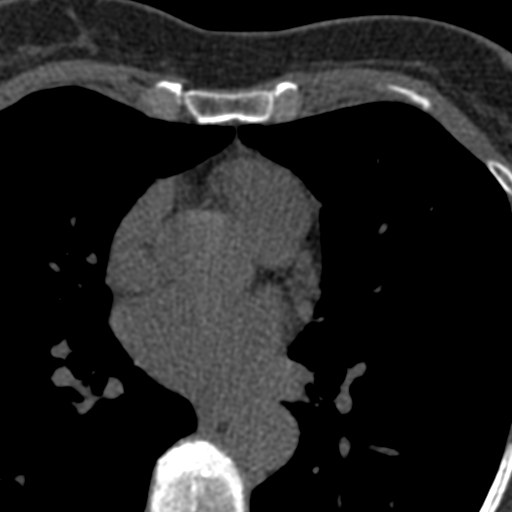
[im 59/80  vessel]
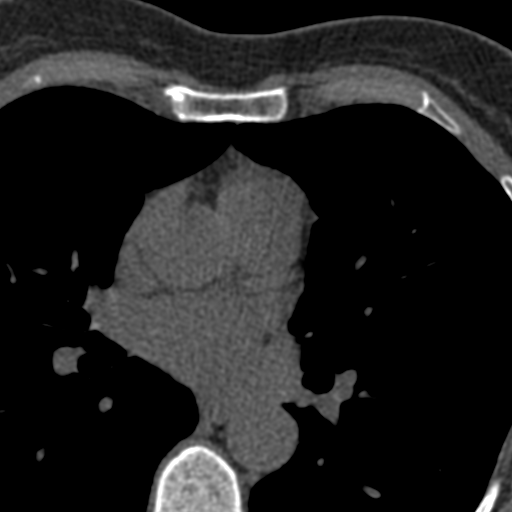
[im 67/80  vessel]
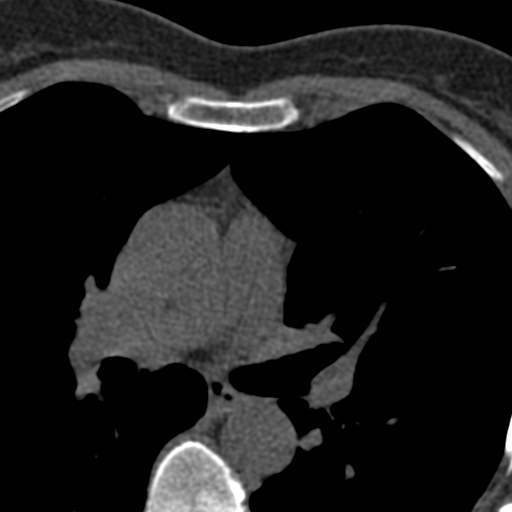
[im 67/80  lung]
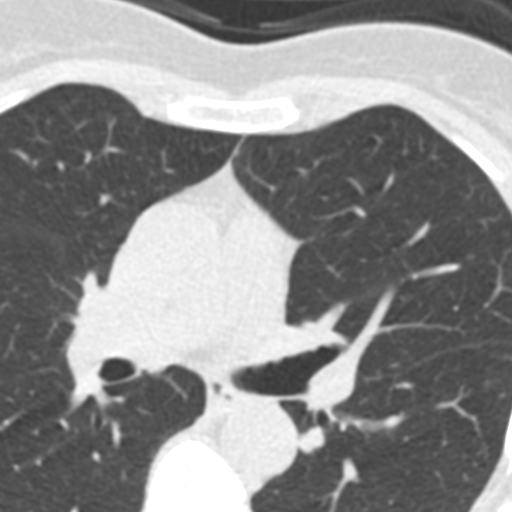
[im 71/80  vessel]
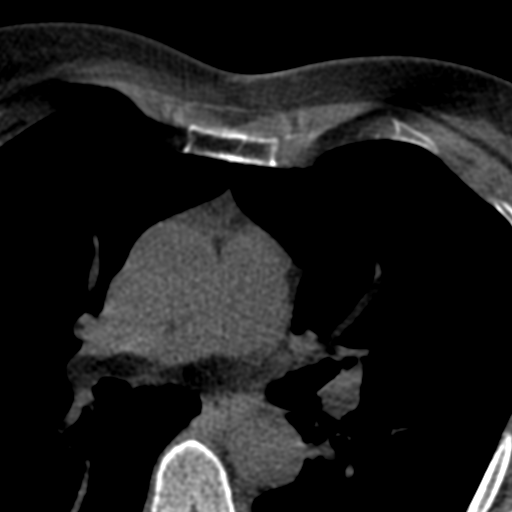
[im 75/80  vessel]
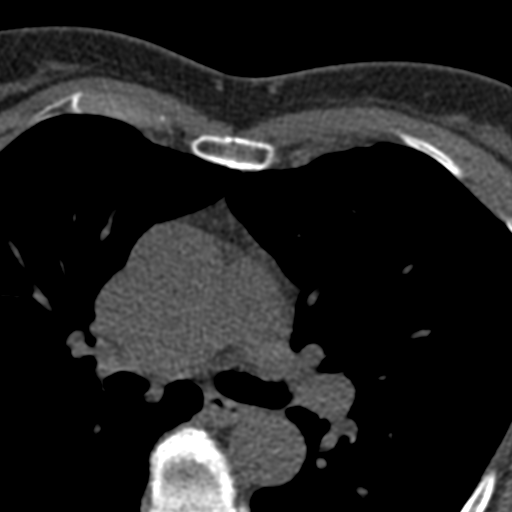

[15 of 20 positions shown; findings below may reference images not displayed]

FINDINGS: Technical quality: Moderate. Motion/misregistration degradation
identified.

CORONARY CALCIUM

Total Agatston Score: 0-no convincing evidence of coronary artery
calcification. Favor artifactual hyperattenuation in the region of
the proximal left circumflex on [DATE].

OTHER FINDINGS:

Cardiovascular: Normal aortic caliber. Normal heart size, without
pericardial effusion.

Mediastinum/Nodes: No imaged thoracic adenopathy.

Lungs/Pleura: No imaged pleural fluid.  Clear imaged lungs.

Upper Abdomen: Normal imaged portions of the liver, spleen, stomach.

Musculoskeletal: No acute osseous abnormality.
IMPRESSION: 1. Motion/misregistration artifact.
2. Given this mild limitation, no coronary calcium identified.
3. No acute process in the imaged chest.

## 2021-01-22 DIAGNOSIS — N3281 Overactive bladder: Secondary | ICD-10-CM | POA: Diagnosis not present

## 2021-03-06 DIAGNOSIS — R7989 Other specified abnormal findings of blood chemistry: Secondary | ICD-10-CM | POA: Diagnosis not present

## 2021-03-06 DIAGNOSIS — F330 Major depressive disorder, recurrent, mild: Secondary | ICD-10-CM | POA: Diagnosis not present

## 2021-03-06 DIAGNOSIS — R5383 Other fatigue: Secondary | ICD-10-CM | POA: Diagnosis not present

## 2021-03-07 DIAGNOSIS — R5383 Other fatigue: Secondary | ICD-10-CM | POA: Diagnosis not present

## 2021-03-07 DIAGNOSIS — R7989 Other specified abnormal findings of blood chemistry: Secondary | ICD-10-CM | POA: Diagnosis not present

## 2021-03-10 DIAGNOSIS — N958 Other specified menopausal and perimenopausal disorders: Secondary | ICD-10-CM | POA: Diagnosis not present

## 2021-03-10 DIAGNOSIS — F419 Anxiety disorder, unspecified: Secondary | ICD-10-CM | POA: Diagnosis not present

## 2021-03-10 DIAGNOSIS — F909 Attention-deficit hyperactivity disorder, unspecified type: Secondary | ICD-10-CM | POA: Diagnosis not present

## 2021-03-10 DIAGNOSIS — J309 Allergic rhinitis, unspecified: Secondary | ICD-10-CM | POA: Diagnosis not present

## 2021-03-10 DIAGNOSIS — R030 Elevated blood-pressure reading, without diagnosis of hypertension: Secondary | ICD-10-CM | POA: Diagnosis not present

## 2021-03-10 DIAGNOSIS — M5416 Radiculopathy, lumbar region: Secondary | ICD-10-CM | POA: Diagnosis not present

## 2021-03-10 DIAGNOSIS — R5383 Other fatigue: Secondary | ICD-10-CM | POA: Diagnosis not present

## 2021-03-10 DIAGNOSIS — M8580 Other specified disorders of bone density and structure, unspecified site: Secondary | ICD-10-CM | POA: Diagnosis not present

## 2021-03-10 DIAGNOSIS — F330 Major depressive disorder, recurrent, mild: Secondary | ICD-10-CM | POA: Diagnosis not present

## 2021-03-12 ENCOUNTER — Ambulatory Visit: Payer: Medicare Other | Attending: Internal Medicine

## 2021-03-12 ENCOUNTER — Other Ambulatory Visit (HOSPITAL_BASED_OUTPATIENT_CLINIC_OR_DEPARTMENT_OTHER): Payer: Self-pay

## 2021-03-12 DIAGNOSIS — Z23 Encounter for immunization: Secondary | ICD-10-CM

## 2021-03-12 MED ORDER — PFIZER COVID-19 VAC BIVALENT 30 MCG/0.3ML IM SUSP
INTRAMUSCULAR | 0 refills | Status: AC
  Filled 2021-03-12: qty 0.3, 1d supply, fill #0

## 2021-03-12 NOTE — Progress Notes (Signed)
   Covid-19 Vaccination Clinic  Name:  Crystal Anthony    MRN: [id number] DOB: [birth year]  03/12/2021  [name] was observed post Covid-19 immunization for 15 minutes without incident. She was provided with Vaccine Information Sheet and instruction to access the V-Safe system.   [name] was instructed to call 911 with any severe reactions post vaccine: Difficulty breathing  Swelling of face and throat  A fast heartbeat  A bad rash all over body  Dizziness and weakness

## 2021-04-12 DIAGNOSIS — Z23 Encounter for immunization: Secondary | ICD-10-CM | POA: Diagnosis not present

## 2021-04-17 DIAGNOSIS — Z20828 Contact with and (suspected) exposure to other viral communicable diseases: Secondary | ICD-10-CM | POA: Diagnosis not present

## 2021-04-22 ENCOUNTER — Other Ambulatory Visit (HOSPITAL_BASED_OUTPATIENT_CLINIC_OR_DEPARTMENT_OTHER): Payer: Self-pay

## 2021-04-22 DIAGNOSIS — Z1272 Encounter for screening for malignant neoplasm of vagina: Secondary | ICD-10-CM | POA: Diagnosis not present

## 2021-04-22 DIAGNOSIS — Z124 Encounter for screening for malignant neoplasm of cervix: Secondary | ICD-10-CM | POA: Diagnosis not present

## 2021-04-22 DIAGNOSIS — Z90710 Acquired absence of both cervix and uterus: Secondary | ICD-10-CM | POA: Diagnosis not present

## 2021-04-22 DIAGNOSIS — Z6824 Body mass index (BMI) 24.0-24.9, adult: Secondary | ICD-10-CM | POA: Diagnosis not present

## 2021-04-22 MED ORDER — AMPHETAMINE-DEXTROAMPHETAMINE 10 MG PO TABS
10.0000 mg | ORAL_TABLET | Freq: Every day | ORAL | 0 refills | Status: AC
  Filled 2021-04-22 – 2021-05-15 (×2): qty 30, 30d supply, fill #0

## 2021-04-22 MED ORDER — ESTRADIOL 0.075 MG/24HR TD PTTW
MEDICATED_PATCH | TRANSDERMAL | 3 refills | Status: DC
  Filled 2021-04-22 – 2021-05-30 (×2): qty 24, 84d supply, fill #0

## 2021-04-23 ENCOUNTER — Other Ambulatory Visit (HOSPITAL_BASED_OUTPATIENT_CLINIC_OR_DEPARTMENT_OTHER): Payer: Self-pay

## 2021-04-24 ENCOUNTER — Other Ambulatory Visit (HOSPITAL_BASED_OUTPATIENT_CLINIC_OR_DEPARTMENT_OTHER): Payer: Self-pay

## 2021-04-24 DIAGNOSIS — N3281 Overactive bladder: Secondary | ICD-10-CM | POA: Diagnosis not present

## 2021-04-24 MED ORDER — MYRBETRIQ 50 MG PO TB24
ORAL_TABLET | ORAL | 5 refills | Status: AC
  Filled 2021-04-24 – 2021-05-15 (×2): qty 30, 30d supply, fill #0
  Filled 2021-05-30 – 2021-06-06 (×2): qty 30, 30d supply, fill #1

## 2021-05-05 ENCOUNTER — Other Ambulatory Visit (HOSPITAL_BASED_OUTPATIENT_CLINIC_OR_DEPARTMENT_OTHER): Payer: Self-pay

## 2021-05-15 ENCOUNTER — Other Ambulatory Visit (HOSPITAL_BASED_OUTPATIENT_CLINIC_OR_DEPARTMENT_OTHER): Payer: Self-pay

## 2021-05-22 DIAGNOSIS — H52223 Regular astigmatism, bilateral: Secondary | ICD-10-CM | POA: Diagnosis not present

## 2021-05-22 DIAGNOSIS — H524 Presbyopia: Secondary | ICD-10-CM | POA: Diagnosis not present

## 2021-05-22 DIAGNOSIS — H2513 Age-related nuclear cataract, bilateral: Secondary | ICD-10-CM | POA: Diagnosis not present

## 2021-05-22 DIAGNOSIS — H5203 Hypermetropia, bilateral: Secondary | ICD-10-CM | POA: Diagnosis not present

## 2021-05-30 ENCOUNTER — Other Ambulatory Visit (HOSPITAL_BASED_OUTPATIENT_CLINIC_OR_DEPARTMENT_OTHER): Payer: Self-pay

## 2021-06-04 ENCOUNTER — Other Ambulatory Visit (HOSPITAL_BASED_OUTPATIENT_CLINIC_OR_DEPARTMENT_OTHER): Payer: Self-pay

## 2021-06-06 ENCOUNTER — Other Ambulatory Visit (HOSPITAL_BASED_OUTPATIENT_CLINIC_OR_DEPARTMENT_OTHER): Payer: Self-pay

## 2021-06-12 DIAGNOSIS — N3281 Overactive bladder: Secondary | ICD-10-CM | POA: Diagnosis not present

## 2021-07-01 DIAGNOSIS — M859 Disorder of bone density and structure, unspecified: Secondary | ICD-10-CM | POA: Diagnosis not present

## 2021-07-01 DIAGNOSIS — Z79899 Other long term (current) drug therapy: Secondary | ICD-10-CM | POA: Diagnosis not present

## 2021-07-01 DIAGNOSIS — R030 Elevated blood-pressure reading, without diagnosis of hypertension: Secondary | ICD-10-CM | POA: Diagnosis not present

## 2021-07-07 DIAGNOSIS — N3946 Mixed incontinence: Secondary | ICD-10-CM | POA: Diagnosis not present

## 2021-07-07 DIAGNOSIS — F909 Attention-deficit hyperactivity disorder, unspecified type: Secondary | ICD-10-CM | POA: Diagnosis not present

## 2021-07-07 DIAGNOSIS — M5416 Radiculopathy, lumbar region: Secondary | ICD-10-CM | POA: Diagnosis not present

## 2021-07-07 DIAGNOSIS — R82998 Other abnormal findings in urine: Secondary | ICD-10-CM | POA: Diagnosis not present

## 2021-07-07 DIAGNOSIS — F419 Anxiety disorder, unspecified: Secondary | ICD-10-CM | POA: Diagnosis not present

## 2021-07-07 DIAGNOSIS — M8580 Other specified disorders of bone density and structure, unspecified site: Secondary | ICD-10-CM | POA: Diagnosis not present

## 2021-07-07 DIAGNOSIS — Z1331 Encounter for screening for depression: Secondary | ICD-10-CM | POA: Diagnosis not present

## 2021-07-07 DIAGNOSIS — F330 Major depressive disorder, recurrent, mild: Secondary | ICD-10-CM | POA: Diagnosis not present

## 2021-07-07 DIAGNOSIS — Z0000 Encounter for general adult medical examination without abnormal findings: Secondary | ICD-10-CM | POA: Diagnosis not present

## 2021-07-07 DIAGNOSIS — R5383 Other fatigue: Secondary | ICD-10-CM | POA: Diagnosis not present

## 2021-07-07 DIAGNOSIS — Z1339 Encounter for screening examination for other mental health and behavioral disorders: Secondary | ICD-10-CM | POA: Diagnosis not present

## 2021-07-07 DIAGNOSIS — R030 Elevated blood-pressure reading, without diagnosis of hypertension: Secondary | ICD-10-CM | POA: Diagnosis not present

## 2021-07-07 DIAGNOSIS — R7301 Impaired fasting glucose: Secondary | ICD-10-CM | POA: Diagnosis not present

## 2021-07-09 DIAGNOSIS — Z1231 Encounter for screening mammogram for malignant neoplasm of breast: Secondary | ICD-10-CM | POA: Diagnosis not present

## 2021-07-14 DIAGNOSIS — Z20822 Contact with and (suspected) exposure to covid-19: Secondary | ICD-10-CM | POA: Diagnosis not present

## 2021-07-23 DIAGNOSIS — Z8041 Family history of malignant neoplasm of ovary: Secondary | ICD-10-CM | POA: Diagnosis not present

## 2021-07-23 DIAGNOSIS — Z8042 Family history of malignant neoplasm of prostate: Secondary | ICD-10-CM | POA: Diagnosis not present

## 2021-07-30 ENCOUNTER — Other Ambulatory Visit (HOSPITAL_BASED_OUTPATIENT_CLINIC_OR_DEPARTMENT_OTHER): Payer: Self-pay

## 2021-07-30 MED ORDER — AMOXICILLIN 500 MG PO CAPS
ORAL_CAPSULE | ORAL | 1 refills | Status: AC
  Filled 2021-07-30: qty 21, 7d supply, fill #0

## 2021-08-18 ENCOUNTER — Other Ambulatory Visit (HOSPITAL_BASED_OUTPATIENT_CLINIC_OR_DEPARTMENT_OTHER): Payer: Self-pay

## 2021-09-11 DIAGNOSIS — Z20822 Contact with and (suspected) exposure to covid-19: Secondary | ICD-10-CM | POA: Diagnosis not present

## 2021-09-12 DIAGNOSIS — Z1152 Encounter for screening for COVID-19: Secondary | ICD-10-CM | POA: Diagnosis not present

## 2021-09-12 DIAGNOSIS — Z20828 Contact with and (suspected) exposure to other viral communicable diseases: Secondary | ICD-10-CM | POA: Diagnosis not present

## 2021-09-22 DIAGNOSIS — Z20822 Contact with and (suspected) exposure to covid-19: Secondary | ICD-10-CM | POA: Diagnosis not present

## 2021-10-14 DIAGNOSIS — D2361 Other benign neoplasm of skin of right upper limb, including shoulder: Secondary | ICD-10-CM | POA: Diagnosis not present

## 2021-10-14 DIAGNOSIS — D2272 Melanocytic nevi of left lower limb, including hip: Secondary | ICD-10-CM | POA: Diagnosis not present

## 2021-10-14 DIAGNOSIS — L819 Disorder of pigmentation, unspecified: Secondary | ICD-10-CM | POA: Diagnosis not present

## 2021-10-14 DIAGNOSIS — Z8041 Family history of malignant neoplasm of ovary: Secondary | ICD-10-CM | POA: Diagnosis not present

## 2021-10-14 DIAGNOSIS — L821 Other seborrheic keratosis: Secondary | ICD-10-CM | POA: Diagnosis not present

## 2021-10-14 DIAGNOSIS — L811 Chloasma: Secondary | ICD-10-CM | POA: Diagnosis not present

## 2021-10-14 DIAGNOSIS — L603 Nail dystrophy: Secondary | ICD-10-CM | POA: Diagnosis not present

## 2021-10-14 DIAGNOSIS — D2372 Other benign neoplasm of skin of left lower limb, including hip: Secondary | ICD-10-CM | POA: Diagnosis not present

## 2021-10-14 DIAGNOSIS — D225 Melanocytic nevi of trunk: Secondary | ICD-10-CM | POA: Diagnosis not present

## 2021-10-14 DIAGNOSIS — D2271 Melanocytic nevi of right lower limb, including hip: Secondary | ICD-10-CM | POA: Diagnosis not present

## 2021-10-14 DIAGNOSIS — D2262 Melanocytic nevi of left upper limb, including shoulder: Secondary | ICD-10-CM | POA: Diagnosis not present

## 2021-10-14 DIAGNOSIS — D235 Other benign neoplasm of skin of trunk: Secondary | ICD-10-CM | POA: Diagnosis not present

## 2021-10-14 DIAGNOSIS — R5383 Other fatigue: Secondary | ICD-10-CM | POA: Diagnosis not present

## 2021-10-15 DIAGNOSIS — R051 Acute cough: Secondary | ICD-10-CM | POA: Diagnosis not present

## 2021-10-15 DIAGNOSIS — Z20822 Contact with and (suspected) exposure to covid-19: Secondary | ICD-10-CM | POA: Diagnosis not present

## 2021-10-15 DIAGNOSIS — R059 Cough, unspecified: Secondary | ICD-10-CM | POA: Diagnosis not present

## 2021-11-10 DIAGNOSIS — H524 Presbyopia: Secondary | ICD-10-CM | POA: Diagnosis not present

## 2021-11-10 DIAGNOSIS — H52223 Regular astigmatism, bilateral: Secondary | ICD-10-CM | POA: Diagnosis not present

## 2021-11-10 DIAGNOSIS — H40013 Open angle with borderline findings, low risk, bilateral: Secondary | ICD-10-CM | POA: Diagnosis not present

## 2021-11-10 DIAGNOSIS — H5203 Hypermetropia, bilateral: Secondary | ICD-10-CM | POA: Diagnosis not present

## 2022-01-28 DIAGNOSIS — N3946 Mixed incontinence: Secondary | ICD-10-CM | POA: Diagnosis not present

## 2022-01-28 DIAGNOSIS — M5416 Radiculopathy, lumbar region: Secondary | ICD-10-CM | POA: Diagnosis not present

## 2022-01-28 DIAGNOSIS — R251 Tremor, unspecified: Secondary | ICD-10-CM | POA: Diagnosis not present

## 2022-02-23 DIAGNOSIS — N958 Other specified menopausal and perimenopausal disorders: Secondary | ICD-10-CM | POA: Diagnosis not present

## 2022-02-23 DIAGNOSIS — M8588 Other specified disorders of bone density and structure, other site: Secondary | ICD-10-CM | POA: Diagnosis not present

## 2022-03-04 ENCOUNTER — Other Ambulatory Visit (HOSPITAL_BASED_OUTPATIENT_CLINIC_OR_DEPARTMENT_OTHER): Payer: Self-pay

## 2022-03-16 DIAGNOSIS — H52223 Regular astigmatism, bilateral: Secondary | ICD-10-CM | POA: Diagnosis not present

## 2022-03-16 DIAGNOSIS — H524 Presbyopia: Secondary | ICD-10-CM | POA: Diagnosis not present

## 2022-03-16 DIAGNOSIS — H40053 Ocular hypertension, bilateral: Secondary | ICD-10-CM | POA: Diagnosis not present

## 2022-03-16 DIAGNOSIS — H5203 Hypermetropia, bilateral: Secondary | ICD-10-CM | POA: Diagnosis not present

## 2022-03-16 DIAGNOSIS — H40013 Open angle with borderline findings, low risk, bilateral: Secondary | ICD-10-CM | POA: Diagnosis not present

## 2022-03-25 DIAGNOSIS — M5136 Other intervertebral disc degeneration, lumbar region: Secondary | ICD-10-CM | POA: Diagnosis not present

## 2022-04-03 DIAGNOSIS — M5416 Radiculopathy, lumbar region: Secondary | ICD-10-CM | POA: Diagnosis not present

## 2022-04-03 DIAGNOSIS — M5136 Other intervertebral disc degeneration, lumbar region: Secondary | ICD-10-CM | POA: Diagnosis not present

## 2022-04-23 ENCOUNTER — Other Ambulatory Visit (HOSPITAL_BASED_OUTPATIENT_CLINIC_OR_DEPARTMENT_OTHER): Payer: Self-pay

## 2022-04-23 DIAGNOSIS — Z6824 Body mass index (BMI) 24.0-24.9, adult: Secondary | ICD-10-CM | POA: Diagnosis not present

## 2022-04-23 DIAGNOSIS — Z01419 Encounter for gynecological examination (general) (routine) without abnormal findings: Secondary | ICD-10-CM | POA: Diagnosis not present

## 2022-04-23 DIAGNOSIS — Z23 Encounter for immunization: Secondary | ICD-10-CM | POA: Diagnosis not present

## 2022-04-23 MED ORDER — COMIRNATY 30 MCG/0.3ML IM SUSY
PREFILLED_SYRINGE | INTRAMUSCULAR | 0 refills | Status: AC
  Filled 2022-04-23: qty 0.3, 1d supply, fill #0

## 2022-05-14 DIAGNOSIS — N3281 Overactive bladder: Secondary | ICD-10-CM | POA: Diagnosis not present

## 2022-05-26 DIAGNOSIS — H5203 Hypermetropia, bilateral: Secondary | ICD-10-CM | POA: Diagnosis not present

## 2022-05-26 DIAGNOSIS — H40053 Ocular hypertension, bilateral: Secondary | ICD-10-CM | POA: Diagnosis not present

## 2022-05-26 DIAGNOSIS — H524 Presbyopia: Secondary | ICD-10-CM | POA: Diagnosis not present

## 2022-05-26 DIAGNOSIS — H2513 Age-related nuclear cataract, bilateral: Secondary | ICD-10-CM | POA: Diagnosis not present

## 2022-05-26 DIAGNOSIS — H53143 Visual discomfort, bilateral: Secondary | ICD-10-CM | POA: Diagnosis not present

## 2022-05-26 DIAGNOSIS — H52223 Regular astigmatism, bilateral: Secondary | ICD-10-CM | POA: Diagnosis not present

## 2022-06-10 DIAGNOSIS — R7301 Impaired fasting glucose: Secondary | ICD-10-CM | POA: Diagnosis not present

## 2022-06-10 DIAGNOSIS — N958 Other specified menopausal and perimenopausal disorders: Secondary | ICD-10-CM | POA: Diagnosis not present

## 2022-06-10 DIAGNOSIS — M8580 Other specified disorders of bone density and structure, unspecified site: Secondary | ICD-10-CM | POA: Diagnosis not present

## 2022-06-10 DIAGNOSIS — R030 Elevated blood-pressure reading, without diagnosis of hypertension: Secondary | ICD-10-CM | POA: Diagnosis not present

## 2022-06-10 DIAGNOSIS — R5382 Chronic fatigue, unspecified: Secondary | ICD-10-CM | POA: Diagnosis not present

## 2022-06-10 DIAGNOSIS — R5383 Other fatigue: Secondary | ICD-10-CM | POA: Diagnosis not present

## 2022-07-10 ENCOUNTER — Other Ambulatory Visit (HOSPITAL_BASED_OUTPATIENT_CLINIC_OR_DEPARTMENT_OTHER): Payer: Self-pay

## 2022-07-11 ENCOUNTER — Other Ambulatory Visit (HOSPITAL_BASED_OUTPATIENT_CLINIC_OR_DEPARTMENT_OTHER): Payer: Self-pay

## 2022-07-11 MED ORDER — ESTRADIOL 0.075 MG/24HR TD PTTW
MEDICATED_PATCH | TRANSDERMAL | 3 refills | Status: AC
  Filled 2022-07-11 (×2): qty 24, 84d supply, fill #0

## 2022-07-12 ENCOUNTER — Other Ambulatory Visit (HOSPITAL_BASED_OUTPATIENT_CLINIC_OR_DEPARTMENT_OTHER): Payer: Self-pay

## 2022-07-13 ENCOUNTER — Other Ambulatory Visit (HOSPITAL_BASED_OUTPATIENT_CLINIC_OR_DEPARTMENT_OTHER): Payer: Self-pay

## 2022-07-16 DIAGNOSIS — R7301 Impaired fasting glucose: Secondary | ICD-10-CM | POA: Diagnosis not present

## 2022-07-16 DIAGNOSIS — R5383 Other fatigue: Secondary | ICD-10-CM | POA: Diagnosis not present

## 2022-07-16 DIAGNOSIS — R251 Tremor, unspecified: Secondary | ICD-10-CM | POA: Diagnosis not present

## 2022-07-16 DIAGNOSIS — M859 Disorder of bone density and structure, unspecified: Secondary | ICD-10-CM | POA: Diagnosis not present

## 2022-07-16 DIAGNOSIS — M8580 Other specified disorders of bone density and structure, unspecified site: Secondary | ICD-10-CM | POA: Diagnosis not present

## 2022-07-16 DIAGNOSIS — R030 Elevated blood-pressure reading, without diagnosis of hypertension: Secondary | ICD-10-CM | POA: Diagnosis not present

## 2022-07-16 DIAGNOSIS — R7989 Other specified abnormal findings of blood chemistry: Secondary | ICD-10-CM | POA: Diagnosis not present

## 2022-07-22 DIAGNOSIS — Z1331 Encounter for screening for depression: Secondary | ICD-10-CM | POA: Diagnosis not present

## 2022-07-22 DIAGNOSIS — N3946 Mixed incontinence: Secondary | ICD-10-CM | POA: Diagnosis not present

## 2022-07-22 DIAGNOSIS — R635 Abnormal weight gain: Secondary | ICD-10-CM | POA: Diagnosis not present

## 2022-07-22 DIAGNOSIS — Z1339 Encounter for screening examination for other mental health and behavioral disorders: Secondary | ICD-10-CM | POA: Diagnosis not present

## 2022-07-22 DIAGNOSIS — J309 Allergic rhinitis, unspecified: Secondary | ICD-10-CM | POA: Diagnosis not present

## 2022-07-22 DIAGNOSIS — R030 Elevated blood-pressure reading, without diagnosis of hypertension: Secondary | ICD-10-CM | POA: Diagnosis not present

## 2022-07-22 DIAGNOSIS — F909 Attention-deficit hyperactivity disorder, unspecified type: Secondary | ICD-10-CM | POA: Diagnosis not present

## 2022-07-22 DIAGNOSIS — M5416 Radiculopathy, lumbar region: Secondary | ICD-10-CM | POA: Diagnosis not present

## 2022-07-22 DIAGNOSIS — Z0000 Encounter for general adult medical examination without abnormal findings: Secondary | ICD-10-CM | POA: Diagnosis not present

## 2022-07-22 DIAGNOSIS — R7301 Impaired fasting glucose: Secondary | ICD-10-CM | POA: Diagnosis not present

## 2022-07-22 DIAGNOSIS — M8580 Other specified disorders of bone density and structure, unspecified site: Secondary | ICD-10-CM | POA: Diagnosis not present

## 2022-07-22 DIAGNOSIS — N958 Other specified menopausal and perimenopausal disorders: Secondary | ICD-10-CM | POA: Diagnosis not present

## 2022-07-23 ENCOUNTER — Other Ambulatory Visit (HOSPITAL_BASED_OUTPATIENT_CLINIC_OR_DEPARTMENT_OTHER): Payer: Self-pay

## 2022-09-10 DIAGNOSIS — H2513 Age-related nuclear cataract, bilateral: Secondary | ICD-10-CM | POA: Diagnosis not present

## 2022-10-22 DIAGNOSIS — N3281 Overactive bladder: Secondary | ICD-10-CM | POA: Diagnosis not present

## 2022-12-22 DIAGNOSIS — H2511 Age-related nuclear cataract, right eye: Secondary | ICD-10-CM | POA: Diagnosis not present

## 2022-12-22 DIAGNOSIS — H25043 Posterior subcapsular polar age-related cataract, bilateral: Secondary | ICD-10-CM | POA: Diagnosis not present

## 2022-12-22 DIAGNOSIS — H18413 Arcus senilis, bilateral: Secondary | ICD-10-CM | POA: Diagnosis not present

## 2022-12-22 DIAGNOSIS — H25013 Cortical age-related cataract, bilateral: Secondary | ICD-10-CM | POA: Diagnosis not present

## 2022-12-22 DIAGNOSIS — H2513 Age-related nuclear cataract, bilateral: Secondary | ICD-10-CM | POA: Diagnosis not present

## 2023-03-08 DIAGNOSIS — H2511 Age-related nuclear cataract, right eye: Secondary | ICD-10-CM | POA: Diagnosis not present

## 2023-03-09 DIAGNOSIS — H2512 Age-related nuclear cataract, left eye: Secondary | ICD-10-CM | POA: Diagnosis not present

## 2023-03-17 DIAGNOSIS — N3281 Overactive bladder: Secondary | ICD-10-CM | POA: Diagnosis not present

## 2023-03-22 DIAGNOSIS — H2512 Age-related nuclear cataract, left eye: Secondary | ICD-10-CM | POA: Diagnosis not present

## 2023-04-05 DIAGNOSIS — R5383 Other fatigue: Secondary | ICD-10-CM | POA: Diagnosis not present

## 2023-04-05 DIAGNOSIS — R0981 Nasal congestion: Secondary | ICD-10-CM | POA: Diagnosis not present

## 2023-04-05 DIAGNOSIS — J040 Acute laryngitis: Secondary | ICD-10-CM | POA: Diagnosis not present

## 2023-04-05 DIAGNOSIS — R051 Acute cough: Secondary | ICD-10-CM | POA: Diagnosis not present

## 2023-04-05 DIAGNOSIS — Z1152 Encounter for screening for COVID-19: Secondary | ICD-10-CM | POA: Diagnosis not present

## 2023-04-05 DIAGNOSIS — J069 Acute upper respiratory infection, unspecified: Secondary | ICD-10-CM | POA: Diagnosis not present

## 2023-04-05 DIAGNOSIS — J309 Allergic rhinitis, unspecified: Secondary | ICD-10-CM | POA: Diagnosis not present

## 2023-05-17 DIAGNOSIS — Z6824 Body mass index (BMI) 24.0-24.9, adult: Secondary | ICD-10-CM | POA: Diagnosis not present

## 2023-05-17 DIAGNOSIS — Z1272 Encounter for screening for malignant neoplasm of vagina: Secondary | ICD-10-CM | POA: Diagnosis not present

## 2023-05-17 DIAGNOSIS — Z124 Encounter for screening for malignant neoplasm of cervix: Secondary | ICD-10-CM | POA: Diagnosis not present

## 2023-06-04 ENCOUNTER — Other Ambulatory Visit (HOSPITAL_BASED_OUTPATIENT_CLINIC_OR_DEPARTMENT_OTHER): Payer: Self-pay

## 2023-06-04 DIAGNOSIS — Z23 Encounter for immunization: Secondary | ICD-10-CM | POA: Diagnosis not present

## 2023-06-04 MED ORDER — FLUAD 0.5 ML IM SUSY
PREFILLED_SYRINGE | INTRAMUSCULAR | 0 refills | Status: AC
  Filled 2023-06-04 (×2): qty 0.5, 1d supply, fill #0

## 2023-06-22 DIAGNOSIS — M222X2 Patellofemoral disorders, left knee: Secondary | ICD-10-CM | POA: Diagnosis not present

## 2023-07-04 ENCOUNTER — Encounter: Payer: Self-pay | Admitting: Gastroenterology

## 2023-07-07 DIAGNOSIS — H04123 Dry eye syndrome of bilateral lacrimal glands: Secondary | ICD-10-CM | POA: Diagnosis not present

## 2023-07-07 DIAGNOSIS — H52223 Regular astigmatism, bilateral: Secondary | ICD-10-CM | POA: Diagnosis not present

## 2023-07-07 DIAGNOSIS — H524 Presbyopia: Secondary | ICD-10-CM | POA: Diagnosis not present

## 2023-07-07 DIAGNOSIS — H11153 Pinguecula, bilateral: Secondary | ICD-10-CM | POA: Diagnosis not present

## 2023-07-16 ENCOUNTER — Ambulatory Visit (INDEPENDENT_AMBULATORY_CARE_PROVIDER_SITE_OTHER): Payer: Medicare Other | Admitting: Orthopaedic Surgery

## 2023-07-16 ENCOUNTER — Other Ambulatory Visit (INDEPENDENT_AMBULATORY_CARE_PROVIDER_SITE_OTHER): Payer: Self-pay

## 2023-07-16 DIAGNOSIS — G8929 Other chronic pain: Secondary | ICD-10-CM | POA: Diagnosis not present

## 2023-07-16 DIAGNOSIS — M25562 Pain in left knee: Secondary | ICD-10-CM

## 2023-07-16 MED ORDER — CELECOXIB 200 MG PO CAPS: 200.0000 mg | ORAL_CAPSULE | Freq: Two times a day (BID) | ORAL | 3 refills | Status: DC

## 2023-07-16 NOTE — Progress Notes (Signed)
 Office Visit Note   Patient: Crystal Anthony           Date of Birth: February 20, 1951           MRN: [id number] Visit Date: 07/16/2023              Requested by: Loreli Elsie JONETTA Mickey., MD [address] [place],  [place] [address] PCP: Loreli Elsie JONETTA Mickey., MD   Assessment & Plan: Visit Diagnoses:  1. Chronic pain of left knee     Plan: Julene is a very pleasant 73 year old female with left knee pain impression is osteoarthritis exacerbation.  She has a minimal amount of degenerative changes on x-rays.  I recommend activity modification as needed.  Knee strengthening exercises.  Celebrex  prescribed.  Will hold off on cortisone injection for now.  Could consider a neoprene compression knee sleeve during activity as well.  She can purchase this from any pharmacy or [employer].  Follow-Up Instructions: No follow-ups on file.   Orders:  Orders Placed This Encounter  Procedures   XR KNEE 3 VIEW LEFT   Meds ordered this encounter  Medications   celecoxib  (CELEBREX ) 200 MG capsule    Sig: Take 1 capsule (200 mg total) by mouth 2 (two) times daily.    Dispense:  30 capsule    Refill:  3      Procedures: No procedures performed   Clinical Data: No additional findings.   Subjective: Chief Complaint  Patient presents with   Left Knee - Pain    HPI Crystal Anthony is a very pleasant 73 year old female here for evaluation of worsening left knee pain for about a month.  Pain is worse with using stairs and sit to stand transition.  Denies any injuries or changes in activity.  Reports that she did exercise less because she had cataract surgery last year. Review of Systems  Constitutional: Negative.   HENT: Negative.    Eyes: Negative.   Respiratory: Negative.    Cardiovascular: Negative.   Endocrine: Negative.   Musculoskeletal:  Positive for arthralgias.  Neurological: Negative.   Hematological: Negative.   Psychiatric/Behavioral: Negative.    All other systems reviewed and are  negative.    Objective: Vital Signs: There were no vitals taken for this visit.  Physical Exam Vitals and nursing note reviewed.  Constitutional:      Appearance: She is well-developed.  HENT:     Head: Atraumatic.     Nose: Nose normal.  Eyes:     Extraocular Movements: Extraocular movements intact.  Cardiovascular:     Pulses: Normal pulses.  Pulmonary:     Effort: Pulmonary effort is normal.  Abdominal:     Palpations: Abdomen is soft.  Musculoskeletal:     Cervical back: Neck supple.  Skin:    General: Skin is warm.     Capillary Refill: Capillary refill takes less than 2 seconds.  Neurological:     Mental Status: She is alert. Mental status is at baseline.  Psychiatric:        Behavior: Behavior normal.        Thought Content: Thought content normal.        Judgment: Judgment normal.     Ortho Exam Examination of the left knee shows a small joint effusion.  Normal range of motion.  Collaterals and cruciates are stable.  Normal strength.  No joint line tenderness. Specialty Comments:  No specialty comments available.  Imaging: XR KNEE 3 VIEW LEFT Result Date: 07/16/2023 X-rays of  the left knee show mild osteoarthritis with periarticular spurring.  Well-preserved joint spaces.    PMFS History: Patient Active Problem List   Diagnosis Date Noted   Family history of ovarian cancer    Family history of prostate cancer    Family history of kidney cancer    Genetic testing 01/22/2015   NECK STIFFNESS 10/22/2006   Backache 10/22/2006   Past Medical History:  Diagnosis Date   DDD (degenerative disc disease), cervical    Family history of kidney cancer    Family history of ovarian cancer    Family history of prostate cancer    No pertinent past medical history    Osteopenia    Wears glasses     Family History  Problem Relation Age of Onset   Ovarian cancer Mother 76   Kidney cancer Father 42   Bone cancer Father 80       mets?   Kidney cancer Maternal  Uncle        dx in his 62s   Colon polyps Paternal Aunt    Heart disease Paternal Aunt    Prostate cancer Paternal Uncle    Heart disease Maternal Grandmother    Heart disease Paternal Grandmother    Heart disease Paternal Grandfather    Prostate cancer Maternal Uncle        dx in his 30s   Prostate cancer Maternal Uncle        dx in his 30s   Cancer Paternal Uncle        NOS   Colon cancer Neg Hx    Esophageal cancer Neg Hx    Rectal cancer Neg Hx    Stomach cancer Neg Hx     Past Surgical History:  Procedure Laterality Date   ABDOMINAL HYSTERECTOMY  1996   complete hysterectomy   BILATERAL SALPINGOOPHORECTOMY  2010   MASS EXCISION  06/14/2012   Procedure: EXCISION MASS;  Surgeon: Franky JONELLE Curia, MD;  Location: [facility];  Service: Orthopedics;  Laterality: Right;  index finger   TONSILLECTOMY     Social History   Occupational History   Not on file  Tobacco Use   Smoking status: Never   Smokeless tobacco: Never   Tobacco comments:    socially in college  Vaping Use   Vaping status: Never Used  Substance and Sexual Activity   Alcohol  use: Yes    Alcohol /week: 0.0 standard drinks of alcohol     Comment: 1 glass of wine per week   Drug use: No   Sexual activity: Not on file

## 2023-07-23 DIAGNOSIS — R7301 Impaired fasting glucose: Secondary | ICD-10-CM | POA: Diagnosis not present

## 2023-07-23 DIAGNOSIS — M8580 Other specified disorders of bone density and structure, unspecified site: Secondary | ICD-10-CM | POA: Diagnosis not present

## 2023-07-23 DIAGNOSIS — R7989 Other specified abnormal findings of blood chemistry: Secondary | ICD-10-CM | POA: Diagnosis not present

## 2023-07-23 DIAGNOSIS — M859 Disorder of bone density and structure, unspecified: Secondary | ICD-10-CM | POA: Diagnosis not present

## 2023-07-23 DIAGNOSIS — R5383 Other fatigue: Secondary | ICD-10-CM | POA: Diagnosis not present

## 2023-07-23 DIAGNOSIS — R635 Abnormal weight gain: Secondary | ICD-10-CM | POA: Diagnosis not present

## 2023-07-30 DIAGNOSIS — Z0000 Encounter for general adult medical examination without abnormal findings: Secondary | ICD-10-CM | POA: Diagnosis not present

## 2023-07-30 DIAGNOSIS — Z7184 Encounter for health counseling related to travel: Secondary | ICD-10-CM | POA: Diagnosis not present

## 2023-07-30 DIAGNOSIS — F909 Attention-deficit hyperactivity disorder, unspecified type: Secondary | ICD-10-CM | POA: Diagnosis not present

## 2023-07-30 DIAGNOSIS — M5416 Radiculopathy, lumbar region: Secondary | ICD-10-CM | POA: Diagnosis not present

## 2023-07-30 DIAGNOSIS — M8580 Other specified disorders of bone density and structure, unspecified site: Secondary | ICD-10-CM | POA: Diagnosis not present

## 2023-07-30 DIAGNOSIS — Z1339 Encounter for screening examination for other mental health and behavioral disorders: Secondary | ICD-10-CM | POA: Diagnosis not present

## 2023-07-30 DIAGNOSIS — M222X2 Patellofemoral disorders, left knee: Secondary | ICD-10-CM | POA: Diagnosis not present

## 2023-07-30 DIAGNOSIS — R030 Elevated blood-pressure reading, without diagnosis of hypertension: Secondary | ICD-10-CM | POA: Diagnosis not present

## 2023-07-30 DIAGNOSIS — Z1331 Encounter for screening for depression: Secondary | ICD-10-CM | POA: Diagnosis not present

## 2023-07-30 DIAGNOSIS — N3946 Mixed incontinence: Secondary | ICD-10-CM | POA: Diagnosis not present

## 2023-07-30 DIAGNOSIS — R7401 Elevation of levels of liver transaminase levels: Secondary | ICD-10-CM | POA: Diagnosis not present

## 2023-07-30 DIAGNOSIS — R7301 Impaired fasting glucose: Secondary | ICD-10-CM | POA: Diagnosis not present

## 2023-08-18 DIAGNOSIS — N3281 Overactive bladder: Secondary | ICD-10-CM | POA: Diagnosis not present

## 2023-09-16 DIAGNOSIS — H1045 Other chronic allergic conjunctivitis: Secondary | ICD-10-CM | POA: Diagnosis not present

## 2023-09-16 DIAGNOSIS — H04122 Dry eye syndrome of left lacrimal gland: Secondary | ICD-10-CM | POA: Diagnosis not present

## 2023-10-19 DIAGNOSIS — L821 Other seborrheic keratosis: Secondary | ICD-10-CM | POA: Diagnosis not present

## 2023-10-19 DIAGNOSIS — D2361 Other benign neoplasm of skin of right upper limb, including shoulder: Secondary | ICD-10-CM | POA: Diagnosis not present

## 2023-10-19 DIAGNOSIS — L814 Other melanin hyperpigmentation: Secondary | ICD-10-CM | POA: Diagnosis not present

## 2023-10-19 DIAGNOSIS — D2262 Melanocytic nevi of left upper limb, including shoulder: Secondary | ICD-10-CM | POA: Diagnosis not present

## 2023-10-19 DIAGNOSIS — D2372 Other benign neoplasm of skin of left lower limb, including hip: Secondary | ICD-10-CM | POA: Diagnosis not present

## 2023-10-31 ENCOUNTER — Other Ambulatory Visit: Payer: Self-pay | Admitting: Orthopaedic Surgery

## 2023-12-07 ENCOUNTER — Ambulatory Visit (INDEPENDENT_AMBULATORY_CARE_PROVIDER_SITE_OTHER): Admitting: Orthopaedic Surgery

## 2023-12-07 DIAGNOSIS — G8929 Other chronic pain: Secondary | ICD-10-CM

## 2023-12-07 DIAGNOSIS — M25562 Pain in left knee: Secondary | ICD-10-CM | POA: Diagnosis not present

## 2023-12-07 MED ORDER — CELECOXIB 200 MG PO CAPS: 200.0000 mg | ORAL_CAPSULE | Freq: Two times a day (BID) | ORAL | 1 refills | Status: AC | PRN

## 2023-12-07 NOTE — Progress Notes (Signed)
 Office Visit Note   Patient: Crystal Anthony           Date of Birth: 1951-01-13           MRN: [id number] Visit Date: 12/07/2023              Requested by: Loreli Elsie JONETTA Mickey., MD [address] [place],  [place] [address] PCP: Loreli Elsie JONETTA Mickey., MD   Assessment & Plan: Visit Diagnoses:  1. Chronic pain of left knee     Plan: History of Present Illness Crystal Anthony is a 73 year old female who presents with follow-up evaluation of left knee pain.  She experiences sharp, localized pain in the middle of the knee, particularly when squatting. The pain does not radiate and is not felt behind the kneecap. It began after performing squats and is absent during tennis. She takes Celebrex  inconsistently, sometimes twice a day and sometimes once a day, depending on symptoms. She has a history of left-sided sciatica, which she believes may be related to her current knee pain. She is active, engaging in regular exercise, including biking and walking. She has previously undergone physical therapy without significant improvement and has not engaged in a home exercise program recently.  Exam of the left knee is unchanged from prior visit.  Assessment and Plan Chronic knee pain Chronic knee pain exacerbated by squats, absent during tennis. Possible tendinosis or cartilage wear. Previous physical therapy ineffective. MRI needed for further evaluation. - Order MRI of the knee. - Continue Celebrex  as needed. - Refill Celebrex  prescription. - Advise activity modification based on symptoms.  Follow-Up Instructions: No follow-ups on file.   Orders:  Orders Placed This Encounter  Procedures   MR Knee Left w/o contrast   Meds ordered this encounter  Medications   celecoxib  (CELEBREX ) 200 MG capsule    Sig: Take 1 capsule (200 mg total) by mouth 2 (two) times daily as needed.    Dispense:  60 capsule    Refill:  1      Procedures: No procedures performed   Clinical Data: No  additional findings.   Subjective: Chief Complaint  Patient presents with   Left Knee - Pain    HPI  Review of Systems  Constitutional: Negative.   HENT: Negative.    Eyes: Negative.   Respiratory: Negative.    Cardiovascular: Negative.   Endocrine: Negative.   Musculoskeletal: Negative.   Neurological: Negative.   Hematological: Negative.   Psychiatric/Behavioral: Negative.    All other systems reviewed and are negative.    Objective: Vital Signs: There were no vitals taken for this visit.  Physical Exam Vitals and nursing note reviewed.  Constitutional:      Appearance: She is well-developed.  HENT:     Head: Atraumatic.     Nose: Nose normal.   Eyes:     Extraocular Movements: Extraocular movements intact.    Cardiovascular:     Pulses: Normal pulses.  Pulmonary:     Effort: Pulmonary effort is normal.  Abdominal:     Palpations: Abdomen is soft.   Musculoskeletal:     Cervical back: Neck supple.   Skin:    General: Skin is warm.     Capillary Refill: Capillary refill takes less than 2 seconds.   Neurological:     Mental Status: She is alert. Mental status is at baseline.   Psychiatric:        Behavior: Behavior normal.  Thought Content: Thought content normal.        Judgment: Judgment normal.     Ortho Exam  Specialty Comments:  No specialty comments available.  Imaging: No results found.   PMFS History: Patient Active Problem List   Diagnosis Date Noted   Family history of ovarian cancer    Family history of prostate cancer    Family history of kidney cancer    Genetic testing 01/22/2015   NECK STIFFNESS 10/22/2006   Backache 10/22/2006   Past Medical History:  Diagnosis Date   DDD (degenerative disc disease), cervical    Family history of kidney cancer    Family history of ovarian cancer    Family history of prostate cancer    No pertinent past medical history    Osteopenia    Wears glasses     Family History   Problem Relation Age of Onset   Ovarian cancer Mother 45   Kidney cancer Father 80   Bone cancer Father 77       mets?   Kidney cancer Maternal Uncle        dx in his 59s   Colon polyps Paternal Aunt    Heart disease Paternal Aunt    Prostate cancer Paternal Uncle    Heart disease Maternal Grandmother    Heart disease Paternal Grandmother    Heart disease Paternal Grandfather    Prostate cancer Maternal Uncle        dx in his 80s   Prostate cancer Maternal Uncle        dx in his 73s   Cancer Paternal Uncle        NOS   Colon cancer Neg Hx    Esophageal cancer Neg Hx    Rectal cancer Neg Hx    Stomach cancer Neg Hx     Past Surgical History:  Procedure Laterality Date   ABDOMINAL HYSTERECTOMY  1996   complete hysterectomy   BILATERAL SALPINGOOPHORECTOMY  2010   MASS EXCISION  06/14/2012   Procedure: EXCISION MASS;  Surgeon: Franky JONELLE Curia, MD;  Location: [facility];  Service: Orthopedics;  Laterality: Right;  index finger   TONSILLECTOMY     Social History   Occupational History   Not on file  Tobacco Use   Smoking status: Never   Smokeless tobacco: Never   Tobacco comments:    socially in college  Vaping Use   Vaping status: Never Used  Substance and Sexual Activity   Alcohol  use: Yes    Alcohol /week: 0.0 standard drinks of alcohol     Comment: 1 glass of wine per week   Drug use: No   Sexual activity: Not on file

## 2023-12-24 ENCOUNTER — Ambulatory Visit
Admission: RE | Admit: 2023-12-24 | Discharge: 2023-12-24 | Disposition: A | Discharge status: Home / Self Care | Source: Ambulatory Visit | Attending: Orthopaedic Surgery | Admitting: Orthopaedic Surgery

## 2023-12-24 DIAGNOSIS — G8929 Other chronic pain: Secondary | ICD-10-CM

## 2023-12-24 DIAGNOSIS — M25562 Pain in left knee: Secondary | ICD-10-CM | POA: Diagnosis not present

## 2024-01-04 ENCOUNTER — Encounter: Payer: Self-pay | Admitting: Orthopaedic Surgery

## 2024-01-04 ENCOUNTER — Ambulatory Visit (INDEPENDENT_AMBULATORY_CARE_PROVIDER_SITE_OTHER): Admitting: Orthopaedic Surgery

## 2024-01-04 ENCOUNTER — Telehealth: Payer: Self-pay

## 2024-01-04 DIAGNOSIS — M1712 Unilateral primary osteoarthritis, left knee: Secondary | ICD-10-CM | POA: Insufficient documentation

## 2024-01-04 NOTE — Progress Notes (Signed)
 Office Visit Note   Patient: Crystal Anthony           Date of Birth: 28-Nov-1950           MRN: [id number] Visit Date: 01/04/2024              Requested by: Loreli Elsie JONETTA Mickey., MD [address] [place],  [place] [address] PCP: Loreli Elsie JONETTA Mickey., MD   Assessment & Plan: Visit Diagnoses:  1. Primary osteoarthritis of left knee     Plan: History of Present Illness Crystal Anthony is a 73 year old female with knee arthritis who presents to discuss recent MRI scan.  She experiences left knee pain, particularly when ascending and descending stairs. The pain is more pronounced when she does not engage her core muscles. Celebrex  alleviates her symptoms, but she prefers to minimize medication use. Interestingly, she does not experience pain while playing tennis.  Results RADIOLOGY Knee MRI: No meniscal tears.  Primarily patellofemoral DJD.  Assessment and Plan Left knee osteoarthritis Chronic osteoarthritis affecting the patellofemoral joint. Pain managed with Celebrex . Exploring alternatives to reduce medication reliance. - Continue Celebrex  as needed. - Initiate gel injection approval process, approximately three weeks for approval. - Consider cortisone injection if symptoms worsen. - Engage in quadriceps strengthening exercises. - Reassess post-cruise for further intervention. - Knee replacement as last resort if symptoms unmanageable.  This patient is diagnosed with osteoarthritis of the knee(s).    Radiographs show evidence of joint space narrowing, osteophytes, subchondral sclerosis and/or subchondral cysts.  This patient has knee pain which interferes with functional and activities of daily living.    This patient has experienced inadequate response, adverse effects and/or intolerance with conservative treatments such as acetaminophen, NSAIDS, topical creams, physical therapy or regular exercise, knee bracing and/or weight loss.   This patient has experienced inadequate  response or has a contraindication to intra articular steroid injections for at least 3 months.   This patient is not scheduled to have a total knee replacement within 6 months of starting treatment with viscosupplementation.  Follow-Up Instructions: No follow-ups on file.   Orders:  No orders of the defined types were placed in this encounter.  No orders of the defined types were placed in this encounter.     Procedures: No procedures performed   Clinical Data: No additional findings.   Subjective: Chief Complaint  Patient presents with   Left Knee - Follow-up    MRI review    HPI  Review of Systems  Constitutional: Negative.   HENT: Negative.    Eyes: Negative.   Respiratory: Negative.    Cardiovascular: Negative.   Endocrine: Negative.   Musculoskeletal: Negative.   Neurological: Negative.   Hematological: Negative.   Psychiatric/Behavioral: Negative.    All other systems reviewed and are negative.    Objective: Vital Signs: There were no vitals taken for this visit.  Physical Exam Vitals and nursing note reviewed.  Constitutional:      Appearance: She is well-developed.  HENT:     Head: Normocephalic and atraumatic.  Pulmonary:     Effort: Pulmonary effort is normal.  Abdominal:     Palpations: Abdomen is soft.  Musculoskeletal:     Cervical back: Neck supple.  Skin:    General: Skin is warm.     Capillary Refill: Capillary refill takes less than 2 seconds.  Neurological:     Mental Status: She is alert and oriented to person, place, and time.  Psychiatric:  Behavior: Behavior normal.        Thought Content: Thought content normal.        Judgment: Judgment normal.     Ortho Exam  Specialty Comments:  No specialty comments available.  Imaging: No results found.   PMFS History: Patient Active Problem List   Diagnosis Date Noted   Primary osteoarthritis of left knee 01/04/2024   Family history of ovarian cancer    Family  history of prostate cancer    Family history of kidney cancer    Genetic testing 01/22/2015   NECK STIFFNESS 10/22/2006   Backache 10/22/2006   Past Medical History:  Diagnosis Date   DDD (degenerative disc disease), cervical    Family history of kidney cancer    Family history of ovarian cancer    Family history of prostate cancer    No pertinent past medical history    Osteopenia    Wears glasses     Family History  Problem Relation Age of Onset   Ovarian cancer Mother 34   Kidney cancer Father 14   Bone cancer Father 65       mets?   Kidney cancer Maternal Uncle        dx in his 9s   Colon polyps Paternal Aunt    Heart disease Paternal Aunt    Prostate cancer Paternal Uncle    Heart disease Maternal Grandmother    Heart disease Paternal Grandmother    Heart disease Paternal Grandfather    Prostate cancer Maternal Uncle        dx in his 49s   Prostate cancer Maternal Uncle        dx in his 66s   Cancer Paternal Uncle        NOS   Colon cancer Neg Hx    Esophageal cancer Neg Hx    Rectal cancer Neg Hx    Stomach cancer Neg Hx     Past Surgical History:  Procedure Laterality Date   ABDOMINAL HYSTERECTOMY  1996   complete hysterectomy   BILATERAL SALPINGOOPHORECTOMY  2010   MASS EXCISION  06/14/2012   Procedure: EXCISION MASS;  Surgeon: Franky JONELLE Curia, MD;  Location: [facility];  Service: Orthopedics;  Laterality: Right;  index finger   TONSILLECTOMY     Social History   Occupational History   Not on file  Tobacco Use   Smoking status: Never   Smokeless tobacco: Never   Tobacco comments:    socially in college  Vaping Use   Vaping status: Never Used  Substance and Sexual Activity   Alcohol  use: Yes    Alcohol /week: 0.0 standard drinks of alcohol     Comment: 1 glass of wine per week   Drug use: No   Sexual activity: Not on file

## 2024-01-04 NOTE — Telephone Encounter (Signed)
Please precert for left knee visco. Dr.Xu's patient. Thanks!  

## 2024-01-13 DIAGNOSIS — N3281 Overactive bladder: Secondary | ICD-10-CM | POA: Diagnosis not present

## 2024-01-14 NOTE — Telephone Encounter (Signed)
 VOB submitted for Monovisc, left knee

## 2024-04-10 ENCOUNTER — Encounter: Payer: Self-pay | Admitting: Radiology

## 2024-04-18 ENCOUNTER — Ambulatory Visit: Admitting: Orthopaedic Surgery

## 2024-04-25 ENCOUNTER — Ambulatory Visit: Admitting: Orthopaedic Surgery

## 2024-04-25 ENCOUNTER — Other Ambulatory Visit: Payer: Self-pay

## 2024-04-25 DIAGNOSIS — M1712 Unilateral primary osteoarthritis, left knee: Secondary | ICD-10-CM

## 2024-04-25 MED ORDER — HYALURONAN 88 MG/4ML IX SOSY
88.0000 mg | PREFILLED_SYRINGE | INTRA_ARTICULAR | Status: AC | PRN
  Administered 2024-04-25: 88 mg via INTRA_ARTICULAR

## 2024-04-25 NOTE — Progress Notes (Signed)
   Procedure Note  Patient: Crystal Anthony             Date of Birth: [birth year]           MRN: [id number]             Visit Date: 04/25/2024  Procedures: Visit Diagnoses:  1. Primary osteoarthritis of left knee     Large Joint Inj: L knee on 04/25/2024 2:58 PM Indications: pain Details: 22 G needle  Arthrogram: No  Medications: 88 mg Hyaluronan 88 MG/4ML Outcome: tolerated well, no immediate complications Patient was prepped and draped in the usual sterile fashion.

## 2024-04-28 ENCOUNTER — Other Ambulatory Visit (HOSPITAL_BASED_OUTPATIENT_CLINIC_OR_DEPARTMENT_OTHER): Payer: Self-pay

## 2024-04-28 DIAGNOSIS — Z23 Encounter for immunization: Secondary | ICD-10-CM | POA: Diagnosis not present

## 2024-04-28 MED ORDER — FLUZONE HIGH-DOSE 0.5 ML IM SUSY
0.5000 mL | PREFILLED_SYRINGE | Freq: Once | INTRAMUSCULAR | 0 refills | Status: AC
  Filled 2024-04-28: qty 0.5, 1d supply, fill #0

## 2024-05-15 ENCOUNTER — Other Ambulatory Visit (HOSPITAL_BASED_OUTPATIENT_CLINIC_OR_DEPARTMENT_OTHER): Payer: Self-pay

## 2024-06-29 ENCOUNTER — Other Ambulatory Visit (HOSPITAL_BASED_OUTPATIENT_CLINIC_OR_DEPARTMENT_OTHER): Payer: Self-pay

## 2024-06-29 MED ORDER — AMPHETAMINE-DEXTROAMPHETAMINE 10 MG PO TABS
10.0000 mg | ORAL_TABLET | Freq: Every day | ORAL | 0 refills | Status: AC
  Filled 2024-06-29: qty 30, 30d supply, fill #0

## 2024-06-29 MED ORDER — ESTRADIOL 0.075 MG/24HR TD PTTW
1.0000 | MEDICATED_PATCH | TRANSDERMAL | 3 refills | Status: AC
  Filled 2024-06-29 (×2): qty 24, 84d supply, fill #0

## 2024-07-06 ENCOUNTER — Other Ambulatory Visit (HOSPITAL_BASED_OUTPATIENT_CLINIC_OR_DEPARTMENT_OTHER): Payer: Self-pay
